# Patient Record
Sex: Female | Born: 1990 | Race: Black or African American | Hispanic: No | Marital: Single | Smoking: Former smoker
Health system: Southern US, Community
[De-identification: ages and names within clinical notes are randomized; demographics above are authoritative.]

## PROBLEM LIST (undated history)

## (undated) ENCOUNTER — Inpatient Hospital Stay (HOSPITAL_COMMUNITY): Payer: Self-pay

## (undated) DIAGNOSIS — IMO0002 Reserved for concepts with insufficient information to code with codable children: Secondary | ICD-10-CM

## (undated) DIAGNOSIS — A599 Trichomoniasis, unspecified: Secondary | ICD-10-CM

## (undated) DIAGNOSIS — B9689 Other specified bacterial agents as the cause of diseases classified elsewhere: Secondary | ICD-10-CM

## (undated) DIAGNOSIS — N760 Acute vaginitis: Secondary | ICD-10-CM

## (undated) DIAGNOSIS — D649 Anemia, unspecified: Secondary | ICD-10-CM

## (undated) HISTORY — DX: Reserved for concepts with insufficient information to code with codable children: IMO0002

## (undated) HISTORY — PX: COLPOSCOPY: SHX161

## (undated) HISTORY — DX: Anemia, unspecified: D64.9

## (undated) HISTORY — PX: TONSILLECTOMY: SUR1361

## (undated) HISTORY — DX: Trichomoniasis, unspecified: A59.9

---
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
## 2008-01-01 ENCOUNTER — Emergency Department (HOSPITAL_COMMUNITY): Admission: EM | Admit: 2008-01-01 | Discharge: 2008-01-01 | Payer: Self-pay | Admitting: Emergency Medicine

## 2008-05-05 ENCOUNTER — Emergency Department (HOSPITAL_COMMUNITY): Admission: EM | Admit: 2008-05-05 | Discharge: 2008-05-05 | Payer: Self-pay | Admitting: Family Medicine

## 2009-02-08 ENCOUNTER — Inpatient Hospital Stay (HOSPITAL_COMMUNITY): Admission: AD | Admit: 2009-02-08 | Discharge: 2009-02-08 | Payer: Self-pay | Admitting: Obstetrics & Gynecology

## 2009-02-10 ENCOUNTER — Inpatient Hospital Stay (HOSPITAL_COMMUNITY): Admission: AD | Admit: 2009-02-10 | Discharge: 2009-02-10 | Payer: Self-pay | Admitting: Obstetrics & Gynecology

## 2010-07-21 ENCOUNTER — Inpatient Hospital Stay (HOSPITAL_COMMUNITY): Admission: AD | Admit: 2010-07-21 | Discharge: 2010-07-21 | Payer: Self-pay | Admitting: Obstetrics and Gynecology

## 2010-07-26 ENCOUNTER — Encounter: Payer: Self-pay | Admitting: Obstetrics & Gynecology

## 2010-07-26 ENCOUNTER — Ambulatory Visit: Payer: Self-pay | Admitting: Obstetrics & Gynecology

## 2010-07-26 LAB — CONVERTED CEMR LAB
Antibody Screen: NEGATIVE
Eosinophils Absolute: 0.3 10*3/uL (ref 0.0–0.7)
Hepatitis B Surface Ag: NEGATIVE
Hgb A2 Quant: 2.6 % (ref 2.2–3.2)
Lymphocytes Relative: 23 % (ref 12–46)
Lymphs Abs: 2.7 10*3/uL (ref 0.7–4.0)
Monocytes Relative: 5 % (ref 3–12)
Neutro Abs: 8.1 10*3/uL — ABNORMAL HIGH (ref 1.7–7.7)
Neutrophils Relative %: 69 % (ref 43–77)
Platelets: 284 10*3/uL (ref 150–400)
RBC: 4.7 M/uL (ref 3.87–5.11)
Rh Type: POSITIVE
Rubella: 21.7 intl units/mL — ABNORMAL HIGH
WBC: 11.8 10*3/uL — ABNORMAL HIGH (ref 4.0–10.5)

## 2010-07-28 ENCOUNTER — Encounter: Payer: Self-pay | Admitting: Physician Assistant

## 2010-07-28 ENCOUNTER — Encounter (INDEPENDENT_AMBULATORY_CARE_PROVIDER_SITE_OTHER): Payer: Self-pay | Admitting: *Deleted

## 2010-07-28 ENCOUNTER — Ambulatory Visit: Payer: Self-pay | Admitting: Obstetrics & Gynecology

## 2010-07-28 LAB — CONVERTED CEMR LAB: Pap Smear: NEGATIVE

## 2010-08-04 ENCOUNTER — Ambulatory Visit: Payer: Self-pay | Admitting: Obstetrics & Gynecology

## 2010-08-10 ENCOUNTER — Ambulatory Visit (HOSPITAL_COMMUNITY): Admission: RE | Admit: 2010-08-10 | Discharge: 2010-08-10 | Payer: Self-pay | Admitting: Family Medicine

## 2010-08-18 ENCOUNTER — Ambulatory Visit: Payer: Self-pay | Admitting: Family Medicine

## 2010-08-20 ENCOUNTER — Inpatient Hospital Stay (HOSPITAL_COMMUNITY)
Admission: AD | Admit: 2010-08-20 | Discharge: 2010-08-21 | Payer: Self-pay | Source: Home / Self Care | Admitting: Obstetrics & Gynecology

## 2010-08-29 ENCOUNTER — Inpatient Hospital Stay (HOSPITAL_COMMUNITY)
Admission: AD | Admit: 2010-08-29 | Discharge: 2010-09-01 | Payer: Self-pay | Source: Home / Self Care | Attending: Obstetrics & Gynecology | Admitting: Obstetrics & Gynecology

## 2010-08-30 ENCOUNTER — Encounter: Payer: Self-pay | Admitting: Obstetrics & Gynecology

## 2010-09-01 ENCOUNTER — Ambulatory Visit: Payer: Self-pay | Admitting: Obstetrics & Gynecology

## 2010-11-29 LAB — GC/CHLAMYDIA PROBE AMP, GENITAL
Chlamydia, DNA Probe: NEGATIVE
Chlamydia, DNA Probe: POSITIVE — AB
GC Probe Amp, Genital: NEGATIVE

## 2010-11-29 LAB — POCT URINALYSIS DIPSTICK
Bilirubin Urine: NEGATIVE
Glucose, UA: NEGATIVE mg/dL
Glucose, UA: NEGATIVE mg/dL
Ketones, ur: NEGATIVE mg/dL
Nitrite: NEGATIVE
Protein, ur: NEGATIVE mg/dL
Protein, ur: NEGATIVE mg/dL
Specific Gravity, Urine: 1.02 (ref 1.005–1.030)
Specific Gravity, Urine: 1.02 (ref 1.005–1.030)
Urobilinogen, UA: 0.2 mg/dL (ref 0.0–1.0)
Urobilinogen, UA: 1 mg/dL (ref 0.0–1.0)
pH: 7.5 (ref 5.0–8.0)

## 2010-11-29 LAB — URINALYSIS, ROUTINE W REFLEX MICROSCOPIC
Bilirubin Urine: NEGATIVE
Glucose, UA: NEGATIVE mg/dL
Glucose, UA: NEGATIVE mg/dL
Ketones, ur: 15 mg/dL — AB
Nitrite: NEGATIVE
Protein, ur: NEGATIVE mg/dL
Specific Gravity, Urine: 1.02 (ref 1.005–1.030)
Specific Gravity, Urine: 1.025 (ref 1.005–1.030)
Urobilinogen, UA: 0.2 mg/dL (ref 0.0–1.0)
Urobilinogen, UA: 1 mg/dL (ref 0.0–1.0)
pH: 6 (ref 5.0–8.0)
pH: 6.5 (ref 5.0–8.0)

## 2010-11-29 LAB — URINE CULTURE
Colony Count: >=100000
Culture  Setup Time: 201112130244

## 2010-11-29 LAB — CBC
Hemoglobin: 11.9 g/dL — ABNORMAL LOW (ref 12.0–15.0)
MCH: 24.3 pg — ABNORMAL LOW (ref 26.0–34.0)
MCV: 76.1 fL — ABNORMAL LOW (ref 78.0–100.0)
RBC: 4.91 MIL/uL (ref 3.87–5.11)

## 2010-11-29 LAB — WET PREP, GENITAL
Clue Cells Wet Prep HPF POC: NONE SEEN
Yeast Wet Prep HPF POC: NONE SEEN

## 2010-11-29 LAB — RAPID URINE DRUG SCREEN, HOSP PERFORMED: Barbiturates: NOT DETECTED

## 2010-11-29 LAB — URINE MICROSCOPIC-ADD ON

## 2010-12-05 ENCOUNTER — Emergency Department (HOSPITAL_COMMUNITY)
Admission: EM | Admit: 2010-12-05 | Discharge: 2010-12-05 | Disposition: A | Discharge status: Home / Self Care | Payer: Medicaid Other | Attending: Emergency Medicine | Admitting: Emergency Medicine

## 2010-12-05 DIAGNOSIS — R0602 Shortness of breath: Secondary | ICD-10-CM | POA: Insufficient documentation

## 2010-12-05 DIAGNOSIS — J45909 Unspecified asthma, uncomplicated: Secondary | ICD-10-CM | POA: Insufficient documentation

## 2010-12-27 LAB — URINALYSIS, ROUTINE W REFLEX MICROSCOPIC
Bilirubin Urine: NEGATIVE
Glucose, UA: NEGATIVE mg/dL
Ketones, ur: NEGATIVE mg/dL
Nitrite: NEGATIVE
Specific Gravity, Urine: 1.02 (ref 1.005–1.030)
pH: 7.5 (ref 5.0–8.0)

## 2010-12-27 LAB — WET PREP, GENITAL

## 2010-12-27 LAB — CBC
Hemoglobin: 11 g/dL — ABNORMAL LOW (ref 12.0–15.0)
MCHC: 32.7 g/dL (ref 30.0–36.0)
MCV: 74.6 fL — ABNORMAL LOW (ref 78.0–100.0)
RBC: 4.51 MIL/uL (ref 3.87–5.11)
RDW: 15 % (ref 11.5–15.5)

## 2010-12-27 LAB — URINE MICROSCOPIC-ADD ON

## 2011-03-09 ENCOUNTER — Inpatient Hospital Stay (HOSPITAL_COMMUNITY)
Admission: AD | Admit: 2011-03-09 | Discharge: 2011-03-09 | Disposition: A | Discharge status: Home / Self Care | Payer: Medicaid Other | Source: Ambulatory Visit | Attending: Obstetrics & Gynecology | Admitting: Obstetrics & Gynecology

## 2011-03-09 DIAGNOSIS — Z3202 Encounter for pregnancy test, result negative: Secondary | ICD-10-CM

## 2011-03-09 DIAGNOSIS — J45909 Unspecified asthma, uncomplicated: Secondary | ICD-10-CM | POA: Insufficient documentation

## 2011-03-09 DIAGNOSIS — R110 Nausea: Secondary | ICD-10-CM

## 2011-03-09 LAB — POCT PREGNANCY, URINE: Preg Test, Ur: NEGATIVE

## 2011-03-09 LAB — URINALYSIS, ROUTINE W REFLEX MICROSCOPIC
Leukocytes, UA: NEGATIVE
Nitrite: NEGATIVE
Protein, ur: NEGATIVE mg/dL
Urobilinogen, UA: 1 mg/dL (ref 0.0–1.0)

## 2011-03-29 ENCOUNTER — Inpatient Hospital Stay (HOSPITAL_COMMUNITY)
Admission: AD | Admit: 2011-03-29 | Discharge: 2011-03-30 | Disposition: A | Discharge status: Home / Self Care | Payer: Medicaid Other | Source: Ambulatory Visit | Attending: Obstetrics & Gynecology | Admitting: Obstetrics & Gynecology

## 2011-03-29 ENCOUNTER — Encounter (HOSPITAL_COMMUNITY): Payer: Self-pay | Admitting: *Deleted

## 2011-03-29 DIAGNOSIS — A499 Bacterial infection, unspecified: Secondary | ICD-10-CM | POA: Insufficient documentation

## 2011-03-29 DIAGNOSIS — B9689 Other specified bacterial agents as the cause of diseases classified elsewhere: Secondary | ICD-10-CM | POA: Insufficient documentation

## 2011-03-29 DIAGNOSIS — R109 Unspecified abdominal pain: Secondary | ICD-10-CM | POA: Insufficient documentation

## 2011-03-29 DIAGNOSIS — N912 Amenorrhea, unspecified: Secondary | ICD-10-CM | POA: Insufficient documentation

## 2011-03-29 DIAGNOSIS — N760 Acute vaginitis: Secondary | ICD-10-CM

## 2011-03-29 LAB — WET PREP, GENITAL: Yeast Wet Prep HPF POC: NONE SEEN

## 2011-03-29 LAB — URINALYSIS, ROUTINE W REFLEX MICROSCOPIC
Bilirubin Urine: NEGATIVE
Glucose, UA: NEGATIVE mg/dL
Nitrite: NEGATIVE

## 2011-03-29 LAB — PREGNANCY, URINE: Preg Test, Ur: NEGATIVE

## 2011-03-29 LAB — POCT PREGNANCY, URINE: Preg Test, Ur: NEGATIVE

## 2011-03-29 MED ORDER — NORGESTIMATE-ETH ESTRADIOL 0.25-35 MG-MCG PO TABS: 1.0000 | ORAL_TABLET | Freq: Every day | ORAL | Status: DC

## 2011-03-29 MED ORDER — METRONIDAZOLE 500 MG PO TABS: 500.0000 mg | ORAL_TABLET | Freq: Two times a day (BID) | ORAL | Status: AC

## 2011-03-29 NOTE — Progress Notes (Signed)
Pt states, " I have missed my period but had a neg HPT. I was cramping in my low abd this am. Brandi Shaw had a vaginal discharge with an odor for three days."

## 2011-03-29 NOTE — Progress Notes (Signed)
S. lineberry at bedside for sse. Cultures and wet prep collected.

## 2011-03-29 NOTE — Progress Notes (Signed)
S. lineberry at bedside.  poc discussed with pt.

## 2011-04-23 NOTE — ED Provider Notes (Signed)
History     Chief Complaint  Patient presents with  . Abdominal Cramping   HPI  Pt is not pregnant and complains of missing a period with negative HPT.  Pt's LMP was 5/20 and she is not using anything for birth control.  She also complains of a vaginal discharge with odor for 3 days.  Pt does admit to gaining weight recently.  She weighs 200 lbs. And is 4'11" tall.  Pt does not want to get pregnant at this time but is concerned about not having a period. Pt denies pain with urination, constipation or diarrhea.  Pt denies dyspareunia.  Past Medical History  Diagnosis Date  . Asthma     Past Surgical History  Procedure Date  . Tonsillectomy   . Colposcopy     Family History  Problem Relation Age of Onset  . Diabetes Mother     History  Substance Use Topics  . Smoking status: Never Smoker   . Smokeless tobacco: Not on file  . Alcohol Use: No    Allergies:  Allergies  Allergen Reactions  . Banana Hives  . Latex Hives  . Penicillins Hives    No prescriptions prior to admission    ROS Physical Exam   Blood pressure 121/61, pulse 70, temperature 98.6 F (37 C), temperature source Oral, resp. rate 18, height 4\' 11"  (1.499 m), weight 200 lb (90.719 kg), last menstrual period 02/05/2011.    Physical Exam Generally well developed WN pleasant obese female in no acute distress.VSS  Nl ROM; neck supple; Abdomen soft nontender; mod amount of frothy white discharge in vault; cervix clean, NT, uterus NSSC nontender; adnexal without palpable enlargement or tenderness.  Exam complicated by obesity.   MAU Course  Procedures pevic exam with wet prep and GC/chlamdyia cultures taken Wet prep showed few clues and few WBCs. MDM   Assessment and Plan Bacterial vaginosis- will treat with Flagyl 500 mg BID for 7 days Amenorrhea with negative pregnancy test- will start pt on BCP and have pt f/u in GYN clinic    [facility] 04/23/2011, 5:11 PM

## 2011-05-12 ENCOUNTER — Encounter: Payer: Medicaid Other | Admitting: Obstetrics and Gynecology

## 2011-06-07 DIAGNOSIS — J45909 Unspecified asthma, uncomplicated: Secondary | ICD-10-CM | POA: Insufficient documentation

## 2011-06-07 DIAGNOSIS — IMO0002 Reserved for concepts with insufficient information to code with codable children: Secondary | ICD-10-CM | POA: Insufficient documentation

## 2011-06-08 ENCOUNTER — Encounter: Payer: Medicaid Other | Admitting: Obstetrics and Gynecology

## 2011-06-13 LAB — POCT URINALYSIS DIP (DEVICE)
Glucose, UA: NEGATIVE
Ketones, ur: NEGATIVE
Nitrite: NEGATIVE
pH: 5.5

## 2011-06-13 LAB — POCT PREGNANCY, URINE
Operator id: 239701
Preg Test, Ur: NEGATIVE

## 2011-06-19 ENCOUNTER — Encounter (HOSPITAL_COMMUNITY): Payer: Self-pay | Admitting: *Deleted

## 2011-06-19 ENCOUNTER — Inpatient Hospital Stay (HOSPITAL_COMMUNITY)
Admission: AD | Admit: 2011-06-19 | Discharge: 2011-06-19 | Disposition: A | Discharge status: Home / Self Care | Payer: Medicaid Other | Source: Ambulatory Visit | Attending: Obstetrics and Gynecology | Admitting: Obstetrics and Gynecology

## 2011-06-19 DIAGNOSIS — O99891 Other specified diseases and conditions complicating pregnancy: Secondary | ICD-10-CM | POA: Insufficient documentation

## 2011-06-19 DIAGNOSIS — Z3200 Encounter for pregnancy test, result unknown: Secondary | ICD-10-CM

## 2011-06-19 DIAGNOSIS — R109 Unspecified abdominal pain: Secondary | ICD-10-CM | POA: Insufficient documentation

## 2011-06-19 LAB — POCT PREGNANCY, URINE: Preg Test, Ur: POSITIVE

## 2011-06-19 NOTE — Progress Notes (Signed)
H. Neese, NP at bedside.  Assessment done and poc discussed with pt.  

## 2011-06-19 NOTE — ED Provider Notes (Signed)
History     CSN: [id number] Arrival date & time: 06/19/2011  8:19 PM  Chief Complaint  Patient presents with  . Abdominal Cramping  . Amenorrhea   HPI Brandi Shaw is a 20 y.o. female who presents to MAU for a pregnancy test. She has had nausea, breast tenderness and frequent urination. She denies abdominal pain. The history was provided by the patient.  Past Medical History  Diagnosis Date  . Asthma   . Abnormal Pap smear 2006    had colposcopy  . Trichimoniasis     Past Surgical History  Procedure Date  . Tonsillectomy   . Colposcopy     Family History  Problem Relation Age of Onset  . Diabetes Mother   . Drug abuse Mother     crack    History  Substance Use Topics  . Smoking status: Never Smoker   . Smokeless tobacco: Not on file  . Alcohol Use: No    OB History    Grav Para Term Preterm Abortions TAB SAB Ect Mult Living   3 1 0 1 1  1  0 0 1      Review of Systems  Allergies  Banana; Latex; and Penicillins  Home Medications  No current outpatient prescriptions on file.  BP 125/64  Pulse 76  Temp(Src) 99 F (37.2 C) (Oral)  Resp 16  Ht 5' 0.5" (1.537 m)  Wt 202 lb 3.2 oz (91.717 kg)  BMI 38.84 kg/m2  LMP 05/07/2011  Breastfeeding? No  Physical Exam  Nursing note and vitals reviewed. Constitutional: She is oriented to person, place, and time. No distress.       obese  HENT:  Head: Normocephalic.  Eyes: EOM are normal.  Neck: Neck supple.  Cardiovascular: Normal rate.   Pulmonary/Chest: Effort normal.  Musculoskeletal: Normal range of motion.  Neurological: She is alert and oriented to person, place, and time. No cranial nerve deficit.    ED Course  Procedures  Assessment: Pregnant  Plan: Patient states she is planning to have abortion  [facility] will [facility].  She will return here for any problems. MDM          Kerrie Buffalo, NP 06/19/11 2256

## 2011-06-19 NOTE — Progress Notes (Signed)
Pt c/o sore nipples and some cramping like a period is about to come on.  Not sure if she is about to start her period or not.

## 2011-06-19 NOTE — Progress Notes (Signed)
Pt G2 P1, LMP 05/07/2011, having cramping, sore breast.

## 2011-06-20 NOTE — ED Provider Notes (Signed)
Agree with above note.  Brandi Shaw 06/20/2011 7:47 AM   

## 2012-03-06 ENCOUNTER — Inpatient Hospital Stay (HOSPITAL_COMMUNITY)
Admission: AD | Admit: 2012-03-06 | Discharge: 2012-03-06 | Disposition: A | Discharge status: Home / Self Care | Payer: Medicaid Other | Source: Ambulatory Visit | Attending: Obstetrics & Gynecology | Admitting: Obstetrics & Gynecology

## 2012-03-06 ENCOUNTER — Encounter (HOSPITAL_COMMUNITY): Payer: Self-pay

## 2012-03-06 DIAGNOSIS — N390 Urinary tract infection, site not specified: Secondary | ICD-10-CM

## 2012-03-06 DIAGNOSIS — R350 Frequency of micturition: Secondary | ICD-10-CM | POA: Insufficient documentation

## 2012-03-06 DIAGNOSIS — O2390 Unspecified genitourinary tract infection in pregnancy, unspecified trimester: Secondary | ICD-10-CM | POA: Insufficient documentation

## 2012-03-06 LAB — URINALYSIS, ROUTINE W REFLEX MICROSCOPIC
Glucose, UA: NEGATIVE mg/dL
Protein, ur: 30 mg/dL — AB
Specific Gravity, Urine: 1.02 (ref 1.005–1.030)
pH: 6.5 (ref 5.0–8.0)

## 2012-03-06 LAB — POCT PREGNANCY, URINE: Preg Test, Ur: POSITIVE — AB

## 2012-03-06 LAB — URINE MICROSCOPIC-ADD ON

## 2012-03-06 MED ORDER — PHENAZOPYRIDINE HCL 200 MG PO TABS: 200.0000 mg | ORAL_TABLET | Freq: Three times a day (TID) | ORAL | Status: AC | PRN

## 2012-03-06 MED ORDER — SULFAMETHOXAZOLE-TMP DS 800-160 MG PO TABS
1.0000 | ORAL_TABLET | Freq: Once | ORAL | Status: AC
  Administered 2012-03-06: 1 via ORAL
  Filled 2012-03-06: qty 1

## 2012-03-06 MED ORDER — PHENAZOPYRIDINE HCL 100 MG PO TABS
200.0000 mg | ORAL_TABLET | Freq: Once | ORAL | Status: AC
  Administered 2012-03-06: 200 mg via ORAL
  Filled 2012-03-06: qty 2

## 2012-03-06 MED ORDER — SULFAMETHOXAZOLE-TRIMETHOPRIM 400-80 MG PO TABS: 1.0000 | ORAL_TABLET | Freq: Two times a day (BID) | ORAL | Status: DC

## 2012-03-06 MED ORDER — SULFAMETHOXAZOLE-TRIMETHOPRIM 400-80 MG PO TABS: 1.0000 | ORAL_TABLET | Freq: Once | ORAL | Status: DC

## 2012-03-06 MED ORDER — SULFAMETHOXAZOLE-TMP DS 800-160 MG PO TABS: 1.0000 | ORAL_TABLET | Freq: Two times a day (BID) | ORAL | Status: AC

## 2012-03-06 NOTE — Discharge Instructions (Signed)
Urinary Tract Infection in Pregnancy A urinary tract infection (UTI) is a bacterial infection of the urinary tract. Infection of the urinary tract can include the ureters, kidneys (pyelonephritis), bladder (cystitis), and urethra (urethritis). All pregnant women should be screened for bacteria in the urinary tract. Identifying and treating a UTI will decrease the risk of preterm labor and developing more serious infections in both the mother and baby. CAUSES Bacteria germs cause almost all UTIs. There are many factors that can increase your chances of getting a UTI during pregnancy. These include:  Having a short urethra.   Poor toilet and hygiene habits.   Sexual intercourse.   Blockage of urine along the urinary tract.   Problems with the pelvic muscles or nerves.   Diabetes.   Obesity.   Bladder problems after having several children.   Previous history of UTI.  SYMPTOMS   Pain, burning, or a stinging feeling when urinating.   Suddenly feeling the need to urinate right away (urgency).   Loss of bladder control (urinary incontinence).   Frequent urination, more than is common with pregnancy.   Lower abdominal or back discomfort.   Bad smelling urine.   Cloudy urine.   Blood in the urine (hematuria).   Fever.  When the kidneys are infected, the symptoms may be:  Back pain.   Flank pain on the right side more so than the left.   Fever.   Chills.   Nausea.   Vomiting.  DIAGNOSIS   Urine tests.   Additional tests and procedures may include:   Ultrasound of the kidneys, ureters, bladder, and urethra.   Looking in the bladder with a lighted tube (cystoscopy).   Certain X-ray studies only when absolutely necessary.  Finding out the results of your test Ask when your test results will be ready. Make sure you get your test results. TREATMENT  Antibiotic medicine by mouth.   Antibiotics given through the vein (intravenously), if needed.  HOME CARE  INSTRUCTIONS   Take your antibiotics as directed. Finish them even if you start to feel better. Only take medicine as directed by your caregiver.   Drink enough fluids to keep your urine clear or pale yellow.   Do not have sexual intercourse until the infection is gone and your caregiver says it is okay.   Make sure you are tested for UTIs throughout your pregnancy if you get one. These infections often come back.  Preventing a UTI in the future:  Practice good toilet habits. Always wipe from front to back. Use the tissue only once.   Do not hold your urine. Empty your bladder as soon as possible when the urge comes.   Do not douche or use deodorant sprays.   Wash with soap and warm water around the genital area and the anus.   Empty your bladder before and after sexual intercourse.   Wear underwear with a cotton crotch.   Avoid caffeine and carbonated drinks. They can irritate the bladder.   Drink cranberry juice or take cranberry pills. This may decrease the risk of getting a UTI.   Do not drink alcohol.   Keep all your appointments and tests as scheduled.  SEEK MEDICAL CARE IF:   Your symptoms get worse.   You are still having fevers 2 or more days after treatment begins.   You develop a rash.   You feel that you are having problems with medicines prescribed.   You develop abnormal vaginal discharge.  SEEK IMMEDIATE MEDICAL   CARE IF:   You develop back or flank pain.   You develop chills.   You have blood in your urine.   You develop nausea and vomiting.   You develop contractions of your uterus.   You have a gush of fluid from the vagina.  MAKE SURE YOU:   Understand these instructions.   Will watch your condition.   Will get help right away if you are not doing well or get worse.  Document Released: 12/30/2010 Document Revised: 08/24/2011 Document Reviewed: 12/30/2010 [facility] Patient Information 2012 [place], [place].Prenatal Care [facility]    [facility]  [phone]     Phone: [phone]          [facility]                      [facility]  @[place]     Phone: [phone]  Phone: [phone]         [facility]     Phone: [phone]  Phone: [phone]           [facility] @ [place] [address] [place]                hone: [phone]  Phone: [phone]         [facility] Dr. Francoise Ceo      Phone: [phone]  Phone: [phone]         [facility] Dept.                Phone: [phone]  [facility]   [phone]    Family [address] [place])          Phone: [phone] [facility]   Phone: [phone]    ________________________________________     To schedule your Maternity Eligibility Appointment, please call [phone].  When you arrive for your appointment you must bring the following items or information listed below.  Your appointment will be rescheduled if you do not have these items or are 15 minutes late. If currently receiving Medicaid, you MUST bring: 1. Medicaid Card 2. Social Security Card 3. Picture ID 4. Proof of Pregnancy 5. Verification of current address if the address on Medicaid card is incorrect "postmarked mail" If not receiving Medicaid, you MUST bring: 1. Social Security Card 2. Picture ID 3. Birth Certificate (if available) Passport or *Green Card 4. Proof of Pregnancy 5. Verification of current address "postmarked mail" for each income presented. 6. Verification of insurance coverage, if any 7. Check stubs from each employer for the previous month (if unable to present check stub  for each week, we will accept check stub for the first and last week ill the same month.) If you can't locate check stubs, you must bring a letter from the employer(s) and it must have the following  information on letterhead, typed, in English: o name of company o company telephone number o how long been with the company, if less than one month o how much person earns per hour o how many hours per week work o the gross pay the person earned for the previous month If you are 23 years old or less, you do not have to bring proof of income unless you work or live with the father of the baby and at that time we will need proof of income  from you and/or the father of the baby. Green Card recipients are eligible for Medicaid for Pregnant Women (MPW)

## 2012-03-06 NOTE — MAU Note (Signed)
Patient is in with c/o dysuria, burning with urination and to verify if she is pregnant. She denies any vaginal bleeding. She also c/o tender breast, white thick odor vaginal discharge.

## 2012-03-06 NOTE — MAU Note (Signed)
Onset of dysuria x 2 to 3 days with urinary frequency LMP 01/21/12

## 2012-03-06 NOTE — MAU Provider Note (Signed)
Chief Complaint:  Urinary Frequency and Dysuria    First Provider Initiated Contact with Patient 03/06/12 2029      Brandi Shaw is  21 y.o. [id number].  Patient's last menstrual period was 01/21/2012.Marland Kitchen  Her pregnancy status is positive.  She presents complaining of Urinary Frequency and Dysuria . Onset is described as intermittent and has been present for  2-3 days. Denies abd pain, vaginal bleeding, vag d/c, fever, chills, back pain, nausea, vomiting or diarrhea.  Obstetrical/Gynecological History: OB History    Grav Para Term Preterm Abortions TAB SAB Ect Mult Living   5 1 0 1 2 1 1 0 0 1       Past Medical History: Past Medical History  Diagnosis Date  . Asthma   . Abnormal Pap smear 2006    had colposcopy  . Trichimoniasis     Past Surgical History: Past Surgical History  Procedure Date  . Tonsillectomy   . Colposcopy     Family History: Family History  Problem Relation Age of Onset  . Diabetes Mother   . Drug abuse Mother     crack    Social History: History  Substance Use Topics  . Smoking status: Never Smoker   . Smokeless tobacco: Not on file  . Alcohol Use: No    Allergies:  Allergies  Allergen Reactions  . Banana Hives  . Latex Hives  . Penicillins Hives    Prescriptions prior to admission  Medication Sig Dispense Refill  . Acetaminophen (TYLENOL 8 HOUR PO) Take 200 mg by mouth daily as needed.        Marland Kitchen albuterol (PROVENTIL,VENTOLIN) 90 MCG/ACT inhaler Inhale 1 puff into the lungs every 6 (six) hours as needed. Shortness of breathe       . norgestimate-ethinyl estradiol (ORTHO-CYCLEN) 0.25-35 MG-MCG per tablet Take 1 tablet by mouth daily.  28 tablet  2    Review of Systems - Negative except what has been reviewed in HPI  Physical Exam   Blood pressure 130/82, pulse 93, temperature 98.6 F (37 C), temperature source Oral, resp. rate 20, height 5' (1.524 m), weight 184 lb 6.4 oz (83.643 kg), last menstrual period 01/21/2012, unknown if  currently breastfeeding.  General: General appearance - alert, well appearing, and in no distress, oriented to person, place, and time and overweight Mental status - alert, oriented to person, place, and time, normal mood, behavior, speech, dress, motor activity, and thought processes, affect appropriate to mood Abdomen - soft, nontender, nondistended, no masses or organomegaly no CVA tenderness Focused Gynecological Exam: exam declined by the patient  Labs: Recent Results (from the past 24 hour(s))  URINALYSIS, ROUTINE W REFLEX MICROSCOPIC   Collection Time   03/06/12  6:40 PM      Component Value Range   Color, Urine YELLOW  YELLOW   APPearance CLEAR  CLEAR   Specific Gravity, Urine 1.020  1.005 - 1.030   pH 6.5  5.0 - 8.0   Glucose, UA NEGATIVE  NEGATIVE mg/dL   Hgb urine dipstick SMALL (*) NEGATIVE   Bilirubin Urine NEGATIVE  NEGATIVE   Ketones, ur 15 (*) NEGATIVE mg/dL   Protein, ur 30 (*) NEGATIVE mg/dL   Urobilinogen, UA 1.0  0.0 - 1.0 mg/dL   Nitrite NEGATIVE  NEGATIVE   Leukocytes, UA TRACE (*) NEGATIVE  URINE MICROSCOPIC-ADD ON   Collection Time   03/06/12  6:40 PM      Component Value Range   Squamous Epithelial / LPF MANY (*) RARE  WBC, UA 7-10  <3 WBC/hpf   RBC / HPF 3-6  <3 RBC/hpf   Urine-Other MUCOUS PRESENT    POCT PREGNANCY, URINE   Collection Time   03/06/12  7:01 PM      Component Value Range   Preg Test, Ur POSITIVE (*) NEGATIVE   Imaging Studies:  Bedside US shows [redacted]w[redacted]d IUP with +Fetal pole, + yolk sac + cardiac activity   Assessment: 1. UTI (lower urinary tract infection)     Plan: Discharge home Rx Septra DS, Pyridium Urine culture pending Referral to health dept for MCD and initiate care, pt will be transferred to [facility] due to hx PTD   Tequia Wolman E. 03/06/2012,8:41 PM

## 2012-03-15 ENCOUNTER — Encounter (HOSPITAL_COMMUNITY): Payer: Self-pay | Admitting: *Deleted

## 2012-03-15 ENCOUNTER — Inpatient Hospital Stay (HOSPITAL_COMMUNITY)
Admission: AD | Admit: 2012-03-15 | Discharge: 2012-03-15 | Disposition: A | Discharge status: Home / Self Care | Payer: Medicaid Other | Source: Ambulatory Visit | Attending: Obstetrics & Gynecology | Admitting: Obstetrics & Gynecology

## 2012-03-15 DIAGNOSIS — N390 Urinary tract infection, site not specified: Secondary | ICD-10-CM

## 2012-03-15 DIAGNOSIS — O210 Mild hyperemesis gravidarum: Secondary | ICD-10-CM | POA: Insufficient documentation

## 2012-03-15 DIAGNOSIS — O219 Vomiting of pregnancy, unspecified: Secondary | ICD-10-CM

## 2012-03-15 DIAGNOSIS — O2390 Unspecified genitourinary tract infection in pregnancy, unspecified trimester: Secondary | ICD-10-CM | POA: Insufficient documentation

## 2012-03-15 LAB — WET PREP, GENITAL: Clue Cells Wet Prep HPF POC: NONE SEEN

## 2012-03-15 LAB — URINALYSIS, ROUTINE W REFLEX MICROSCOPIC
Hgb urine dipstick: NEGATIVE
Specific Gravity, Urine: 1.025 (ref 1.005–1.030)
Urobilinogen, UA: 1 mg/dL (ref 0.0–1.0)

## 2012-03-15 MED ORDER — PROMETHAZINE HCL 12.5 MG PO TABS: 12.5000 mg | ORAL_TABLET | Freq: Four times a day (QID) | ORAL | Status: DC | PRN

## 2012-03-15 NOTE — MAU Note (Signed)
Patient states she is taking medication for a UTI that she thinks might be making her having nausea and vomiting. Having a vaginal discharge with odor and vaginal stinging. States she is not getting better.

## 2012-03-15 NOTE — Discharge Instructions (Signed)
Urinary Tract Infection in Pregnancy A urinary tract infection (UTI) is a bacterial infection of the urinary tract. Infection of the urinary tract can include the ureters, kidneys (pyelonephritis), bladder (cystitis), and urethra (urethritis). All pregnant women should be screened for bacteria in the urinary tract. Identifying and treating a UTI will decrease the risk of preterm labor and developing more serious infections in both the mother and baby. CAUSES Bacteria germs cause almost all UTIs. There are many factors that can increase your chances of getting a UTI during pregnancy. These include:  Having a short urethra.   Poor toilet and hygiene habits.   Sexual intercourse.   Blockage of urine along the urinary tract.   Problems with the pelvic muscles or nerves.   Diabetes.   Obesity.   Bladder problems after having several children.   Previous history of UTI.  SYMPTOMS   Pain, burning, or a stinging feeling when urinating.   Suddenly feeling the need to urinate right away (urgency).   Loss of bladder control (urinary incontinence).   Frequent urination, more than is common with pregnancy.   Lower abdominal or back discomfort.   Bad smelling urine.   Cloudy urine.   Blood in the urine (hematuria).   Fever.  When the kidneys are infected, the symptoms may be:  Back pain.   Flank pain on the right side more so than the left.   Fever.   Chills.   Nausea.   Vomiting.  DIAGNOSIS   Urine tests.   Additional tests and procedures may include:   Ultrasound of the kidneys, ureters, bladder, and urethra.   Looking in the bladder with a lighted tube (cystoscopy).   Certain X-ray studies only when absolutely necessary.  Finding out the results of your test Ask when your test results will be ready. Make sure you get your test results. TREATMENT  Antibiotic medicine by mouth.   Antibiotics given through the vein (intravenously), if needed.  HOME CARE  INSTRUCTIONS   Take your antibiotics as directed. Finish them even if you start to feel better. Only take medicine as directed by your caregiver.   Drink enough fluids to keep your urine clear or pale yellow.   Do not have sexual intercourse until the infection is gone and your caregiver says it is okay.   Make sure you are tested for UTIs throughout your pregnancy if you get one. These infections often come back.  Preventing a UTI in the future:  Practice good toilet habits. Always wipe from front to back. Use the tissue only once.   Do not hold your urine. Empty your bladder as soon as possible when the urge comes.   Do not douche or use deodorant sprays.   Wash with soap and warm water around the genital area and the anus.   Empty your bladder before and after sexual intercourse.   Wear underwear with a cotton crotch.   Avoid caffeine and carbonated drinks. They can irritate the bladder.   Drink cranberry juice or take cranberry pills. This may decrease the risk of getting a UTI.   Do not drink alcohol.   Keep all your appointments and tests as scheduled.  SEEK MEDICAL CARE IF:   Your symptoms get worse.   You are still having fevers 2 or more days after treatment begins.   You develop a rash.   You feel that you are having problems with medicines prescribed.   You develop abnormal vaginal discharge.  SEEK IMMEDIATE MEDICAL   CARE IF:   You develop back or flank pain.   You develop chills.   You have blood in your urine.   You develop nausea and vomiting.   You develop contractions of your uterus.   You have a gush of fluid from the vagina.  MAKE SURE YOU:   Understand these instructions.   Will watch your condition.   Will get help right away if you are not doing well or get worse.  Document Released: 12/30/2010 Document Revised: 08/24/2011 Document Reviewed: 12/30/2010 [employer] Patient Information 2012 [employer]. 

## 2012-03-15 NOTE — MAU Provider Note (Signed)
Brandi Shaw [id number] @[redacted]w[redacted]d  by LMP Chief Complaint  Patient presents with  . Emesis During Pregnancy     First Provider Initiated Contact with Patient 03/15/12 1702      SUBJECTIVE  HPI: She presented here 03/06/2012 symptomatic for UTI and was treated with Bactrim. She's missed some doses due to nausea and vomiting of pregnancy. She is concerned that she still has end stream burning sensation. (No urine culture done.)  Did not have pelvic at last visit. Denies abnormal vaginal discharge. No systemic UTI symptoms, i.e. no fever, chills, back pain. Does not have meds for N/V.  OB HX: SVD at 33 wks  Past Medical History  Diagnosis Date  . Asthma   . Abnormal Pap smear 2006    had colposcopy  . Trichimoniasis    Past Surgical History  Procedure Date  . Tonsillectomy   . Colposcopy    History   Social History  . Marital Status: Single    Spouse Name: N/A    Number of Children: N/A  . Years of Education: N/A   Occupational History  . Not on file.   Social History Main Topics  . Smoking status: Former Smoker    Quit date: 03/15/2008  . Smokeless tobacco: Not on file  . Alcohol Use: No  . Drug Use: No  . Sexually Active: Yes   Other Topics Concern  . Not on file   Social History Narrative  . No narrative on file   No current facility-administered medications on file prior to encounter.   Current Outpatient Prescriptions on File Prior to Encounter  Medication Sig Dispense Refill  . sulfamethoxazole-trimethoprim (BACTRIM DS) 800-160 MG per tablet Take 1 tablet by mouth 2 (two) times daily.  14 tablet  0  . albuterol (PROVENTIL,VENTOLIN) 90 MCG/ACT inhaler Inhale 1 puff into the lungs every 6 (six) hours as needed. Shortness of breathe        Allergies  Allergen Reactions  . Banana Hives  . Latex Hives  . Penicillins Hives    ROS: Pertinent items in HPI  OBJECTIVE Blood pressure 122/53, pulse 95, temperature 98.8 F (37.1 C), temperature source  Oral, resp. rate 16, weight 81.103 kg (178 lb 12.8 oz), last menstrual period 01/21/2012, SpO2 100.00%, unknown if currently breastfeeding.  GENERAL: Obese female in no acute distress.  HEENT: Normocephalic, good dentition HEART: normal rate RESP: normal effort ABDOMEN: Soft, nontender EXTREMITIES: Nontender, no edema NEURO: Alert and oriented SPECULUM EXAM: NEFG, physiologic discharge, no blood noted, cervix clean BIMANUAL: cervix L/C/H; uterus NSSP; no adnexal tenderness or masses   LAB RESULTS  Results for orders placed during the hospital encounter of 03/15/12 (from the past 24 hour(s))  URINALYSIS, ROUTINE W REFLEX MICROSCOPIC     Status: Abnormal   Collection Time   03/15/12  3:10 PM      Component Value Range   Color, Urine YELLOW  YELLOW   APPearance CLOUDY (*) CLEAR   Specific Gravity, Urine 1.025  1.005 - 1.030   pH 6.5  5.0 - 8.0   Glucose, UA NEGATIVE  NEGATIVE mg/dL   Hgb urine dipstick NEGATIVE  NEGATIVE   Bilirubin Urine SMALL (*) NEGATIVE   Ketones, ur NEGATIVE  NEGATIVE mg/dL   Protein, ur NEGATIVE  NEGATIVE mg/dL   Urobilinogen, UA 1.0  0.0 - 1.0 mg/dL   Nitrite NEGATIVE  NEGATIVE   Leukocytes, UA NEGATIVE  NEGATIVE  WET PREP, GENITAL     Status: Abnormal   Collection Time  03/15/12  5:25 PM      Component Value Range   Yeast Wet Prep HPF POC NONE SEEN  NONE SEEN   Trich, Wet Prep NONE SEEN  NONE SEEN   Clue Cells Wet Prep HPF POC NONE SEEN  NONE SEEN   WBC, Wet Prep HPF POC FEW (*) NONE SEEN    IMAGING (Viable IUP at prior visit)  ASSESSMENT [id number] at [redacted]w[redacted]d UTI partially treated-> urine culture sent. Continue Pyridium 1. UTI (urinary tract infection)   2. Nausea and vomiting of pregnancy, antepartum   3. Hx PTB  PLAN  Urine C&S sent  Medication List  As of 03/15/2012  5:12 PM   ASK your doctor about these medications         albuterol 90 MCG/ACT inhaler   Commonly known as: PROVENTIL,VENTOLIN   Inhale 1 puff into the lungs every 6 (six)  hours as needed. Shortness of breathe      PYRIDIUM 100 MG tablet   Generic drug: phenazopyridine   Take 100 mg by mouth 3 (three) times daily as needed. For pain.      sulfamethoxazole-trimethoprim 800-160 MG per tablet   Commonly known as: BACTRIM DS   Take 1 tablet by mouth 2 (two) times daily.           Rx: Phenergan 12.5mg  q 4-6 h prn N/V F/U: Someone from high risk clinic will call you for appointment. Explained need for early [facility] and 17-P       Mishawn Didion 03/15/2012 5:12 PM

## 2012-03-17 LAB — URINE CULTURE: Special Requests: NORMAL

## 2012-03-19 NOTE — MAU Provider Note (Signed)
Medical Screening exam and patient care preformed by advanced practice provider.  Agree with the above management.  

## 2012-04-17 ENCOUNTER — Inpatient Hospital Stay (HOSPITAL_COMMUNITY)
Admission: AD | Admit: 2012-04-17 | Discharge: 2012-04-18 | Disposition: A | Discharge status: Home / Self Care | Payer: Medicaid Other | Source: Ambulatory Visit | Attending: Obstetrics and Gynecology | Admitting: Obstetrics and Gynecology

## 2012-04-17 ENCOUNTER — Encounter (HOSPITAL_COMMUNITY): Payer: Self-pay | Admitting: *Deleted

## 2012-04-17 DIAGNOSIS — O0930 Supervision of pregnancy with insufficient antenatal care, unspecified trimester: Secondary | ICD-10-CM | POA: Insufficient documentation

## 2012-04-17 DIAGNOSIS — O2390 Unspecified genitourinary tract infection in pregnancy, unspecified trimester: Secondary | ICD-10-CM | POA: Insufficient documentation

## 2012-04-17 DIAGNOSIS — R109 Unspecified abdominal pain: Secondary | ICD-10-CM | POA: Insufficient documentation

## 2012-04-17 DIAGNOSIS — B3731 Acute candidiasis of vulva and vagina: Secondary | ICD-10-CM | POA: Diagnosis present

## 2012-04-17 DIAGNOSIS — B373 Candidiasis of vulva and vagina: Secondary | ICD-10-CM | POA: Insufficient documentation

## 2012-04-17 LAB — URINALYSIS, ROUTINE W REFLEX MICROSCOPIC
Glucose, UA: NEGATIVE mg/dL
Leukocytes, UA: NEGATIVE
Specific Gravity, Urine: 1.025 (ref 1.005–1.030)
pH: 6 (ref 5.0–8.0)

## 2012-04-17 LAB — CBC
MCH: 22.5 pg — ABNORMAL LOW (ref 26.0–34.0)
MCV: 70.2 fL — ABNORMAL LOW (ref 78.0–100.0)
Platelets: 279 10*3/uL (ref 150–400)
RDW: 14.2 % (ref 11.5–15.5)

## 2012-04-17 LAB — WET PREP, GENITAL
Trich, Wet Prep: NONE SEEN
Yeast Wet Prep HPF POC: NONE SEEN

## 2012-04-17 NOTE — MAU Provider Note (Signed)
Brandi Shaw [id number] @[redacted]w[redacted]d  by LMP Chief Complaint  Patient presents with  . Abdominal Cramping     First Provider Initiated Contact with Patient 04/17/12 2236      SUBJECTIVE  HPI: Pt presents to MAU with abdominal cramping and vaginal discharge and pink spotting x2 days.  She also has vaginal itching today.  She has not had prenatal care in this pregnancy and has not yet applied for pregnancy Medicaid but has had visits in the MAU. She denies LOF, bright red vaginal bleeding, urinary symptoms, h/a, dizziness, n/v, or fever/chills.    Past Medical History  Diagnosis Date  . Asthma   . Abnormal Pap smear 2006    had colposcopy  . Trichimoniasis    Past Surgical History  Procedure Date  . Tonsillectomy   . Colposcopy    History   Social History  . Marital Status: Single    Spouse Name: N/A    Number of Children: N/A  . Years of Education: N/A   Occupational History  . Not on file.   Social History Main Topics  . Smoking status: Former Smoker    Quit date: 03/15/2008  . Smokeless tobacco: Not on file  . Alcohol Use: No  . Drug Use: No  . Sexually Active: Yes   Other Topics Concern  . Not on file   Social History Narrative  . No narrative on file   No current facility-administered medications on file prior to encounter.   Current Outpatient Prescriptions on File Prior to Encounter  Medication Sig Dispense Refill  . albuterol (PROVENTIL,VENTOLIN) 90 MCG/ACT inhaler Inhale 1 puff into the lungs every 6 (six) hours as needed. Shortness of breathe       . phenazopyridine (PYRIDIUM) 100 MG tablet Take 100 mg by mouth 3 (three) times daily as needed. For pain.      . promethazine (PHENERGAN) 12.5 MG tablet Take 1 tablet (12.5 mg total) by mouth every 6 (six) hours as needed for nausea.  30 tablet  0   Allergies  Allergen Reactions  . Banana Hives  . Latex Hives  . Penicillins Hives    ROS: Pertinent items in HPI  OBJECTIVE Blood pressure 120/74,  pulse 93, temperature 99.3 F (37.4 C), temperature source Oral, resp. rate 20, height 5' (1.524 m), weight 78.019 kg (172 lb), last menstrual period 01/21/2012, SpO2 100.00%.  GENERAL: Well-developed, well-nourished female in no acute distress.  HEENT: Normocephalic, good dentition HEART: normal rate RESP: normal effort ABDOMEN: Soft, nontender EXTREMITIES: Nontender, no edema NEURO: Alert and oriented Pelvic exam: Cervix pink, some patches of erythema, visually closed,  scant white discharge with clumps, vaginal walls with mild erythema, and external genitalia normal Bimanual exam: Cervix 0/long/high, firm, posterior  LAB RESULTS  Results for orders placed during the hospital encounter of 04/17/12 (from the past 24 hour(s))  URINALYSIS, ROUTINE W REFLEX MICROSCOPIC     Status: Abnormal   Collection Time   04/17/12  9:30 PM      Component Value Range   Color, Urine YELLOW  YELLOW   APPearance CLEAR  CLEAR   Specific Gravity, Urine 1.025  1.005 - 1.030   pH 6.0  5.0 - 8.0   Glucose, UA NEGATIVE  NEGATIVE mg/dL   Hgb urine dipstick NEGATIVE  NEGATIVE   Bilirubin Urine NEGATIVE  NEGATIVE   Ketones, ur 15 (*) NEGATIVE mg/dL   Protein, ur NEGATIVE  NEGATIVE mg/dL   Urobilinogen, UA 1.0  0.0 - 1.0 mg/dL  Nitrite NEGATIVE  NEGATIVE   Leukocytes, UA NEGATIVE  NEGATIVE  WET PREP, GENITAL     Status: Abnormal   Collection Time   04/17/12 11:15 PM      Component Value Range   Yeast Wet Prep HPF POC NONE SEEN  NONE SEEN   Trich, Wet Prep NONE SEEN  NONE SEEN   Clue Cells Wet Prep HPF POC NONE SEEN  NONE SEEN   WBC, Wet Prep HPF POC FEW (*) NONE SEEN  CBC     Status: Abnormal   Collection Time   04/17/12 11:27 PM      Component Value Range   WBC 11.7 (*) 4.0 - 10.5 K/uL   RBC 4.40  3.87 - 5.11 MIL/uL   Hemoglobin 9.9 (*) 12.0 - 15.0 g/dL   HCT 16.1 (*) 09.6 - 04.5 %   MCV 70.2 (*) 78.0 - 100.0 fL   MCH 22.5 (*) 26.0 - 34.0 pg   MCHC 32.0  30.0 - 36.0 g/dL   RDW 40.9  81.1 - 91.4  %   Platelets 279  150 - 400 K/uL    IMAGING Bedside sono indicates FHR, fetal movement, and adequate fluid  ASSESSMENT Candidal vaginitis No prenatal care  PLAN Diflucan 150 mg PO x1 dose in MAU D/C home with bleeding precautions Begin prenatal care as soon as possible Message sent to [facility] for appointment Return to MAU as needed  Medication List  As of 04/17/2012 11:58 PM   ASK your doctor about these medications         albuterol 90 MCG/ACT inhaler   Commonly known as: PROVENTIL,VENTOLIN   Inhale 1 puff into the lungs every 6 (six) hours as needed. Shortness of breathe      ibuprofen 200 MG tablet   Commonly known as: ADVIL,MOTRIN   Take 200 mg by mouth every 6 (six) hours as needed. headache      promethazine 12.5 MG tablet   Commonly known as: PHENERGAN   Take 1 tablet (12.5 mg total) by mouth every 6 (six) hours as needed for nausea.      PYRIDIUM 100 MG tablet   Generic drug: phenazopyridine   Take 100 mg by mouth 3 (three) times daily as needed. For pain.           No Follow-up on file.     Brandi Shaw 04/17/2012 11:58 PM

## 2012-04-17 NOTE — MAU Note (Signed)
Pt reports "extreme cramping" x 2 days, spotting x 2 days.

## 2012-04-18 ENCOUNTER — Ambulatory Visit (HOSPITAL_COMMUNITY): Payer: Medicaid Other

## 2012-04-18 DIAGNOSIS — B373 Candidiasis of vulva and vagina: Secondary | ICD-10-CM | POA: Diagnosis present

## 2012-04-18 LAB — GC/CHLAMYDIA PROBE AMP, GENITAL
Chlamydia, DNA Probe: NEGATIVE
GC Probe Amp, Genital: NEGATIVE

## 2012-04-18 MED ORDER — FLUCONAZOLE 150 MG PO TABS
150.0000 mg | ORAL_TABLET | ORAL | Status: AC
  Administered 2012-04-18: 150 mg via ORAL
  Filled 2012-04-18: qty 1

## 2012-04-21 ENCOUNTER — Encounter (HOSPITAL_COMMUNITY): Payer: Self-pay | Admitting: Obstetrics and Gynecology

## 2012-04-21 ENCOUNTER — Inpatient Hospital Stay (HOSPITAL_COMMUNITY)
Admission: AD | Admit: 2012-04-21 | Discharge: 2012-04-21 | Disposition: A | Discharge status: Home / Self Care | Payer: Medicaid Other | Source: Ambulatory Visit | Attending: Obstetrics & Gynecology | Admitting: Obstetrics & Gynecology

## 2012-04-21 DIAGNOSIS — O99891 Other specified diseases and conditions complicating pregnancy: Secondary | ICD-10-CM | POA: Insufficient documentation

## 2012-04-21 DIAGNOSIS — O9A219 Injury, poisoning and certain other consequences of external causes complicating pregnancy, unspecified trimester: Secondary | ICD-10-CM

## 2012-04-21 DIAGNOSIS — T1490XA Injury, unspecified, initial encounter: Secondary | ICD-10-CM

## 2012-04-21 DIAGNOSIS — W108XXA Fall (on) (from) other stairs and steps, initial encounter: Secondary | ICD-10-CM | POA: Insufficient documentation

## 2012-04-21 MED ORDER — IBUPROFEN 600 MG PO TABS: 600.0000 mg | ORAL_TABLET | Freq: Four times a day (QID) | ORAL | Status: DC | PRN

## 2012-04-21 NOTE — MAU Provider Note (Signed)
Brandi Shaw [id number] @[redacted]w[redacted]d  by LMP Chief Complaint  Patient presents with  . Fall    First Provider Initiated Contact with Patient 04/21/12 1634     SUBJECTIVE HPI: Pt reports tripped and rolled down about 8 steps while rushing. Pt reports she feels mild abd cramping. Denies spotting, bleeding or injuries to other parts of her body. States that fall was an accident and not preceded by syncope or altercation. Plans termination.   Past Medical History  Diagnosis Date  . Asthma   . Abnormal Pap smear 2006    had colposcopy  . Trichimoniasis    Past Surgical History  Procedure Date  . Tonsillectomy   . Colposcopy    History   Social History  . Marital Status: Single    Spouse Name: N/A    Number of Children: N/A  . Years of Education: N/A   Occupational History  . Not on file.   Social History Main Topics  . Smoking status: Former Smoker    Quit date: 03/15/2008  . Smokeless tobacco: Not on file  . Alcohol Use: No  . Drug Use: No  . Sexually Active: Yes   Other Topics Concern  . Not on file   Social History Narrative  . No narrative on file   No current facility-administered medications on file prior to encounter.   Current Outpatient Prescriptions on File Prior to Encounter  Medication Sig Dispense Refill  . albuterol (PROVENTIL,VENTOLIN) 90 MCG/ACT inhaler Inhale 1 puff into the lungs every 6 (six) hours as needed. Shortness of breathe        Allergies  Allergen Reactions  . Banana Hives  . Latex Hives  . Penicillins Hives   ROS: Pertinent items in HPI  OBJECTIVE Blood pressure 123/66, pulse 74, temperature 98.8 F (37.1 C), temperature source Oral, resp. rate 20, height 5' (1.524 m), weight 76.204 kg (168 lb), last menstrual period 01/21/2012.  GENERAL: Well-developed, well-nourished female in no acute distress.  HEENT: Normocephalic, good dentition HEART: normal rate RESP: normal effort ABDOMEN: Soft, nontender EXTREMITIES: Nontender,  no edema NEURO: Alert and oriented SPECULUM EXAM: deferred FHR: 155  LAB RESULTS No results found for this or any previous visit (from the past 24 hour(s)).  IMAGING NA  ASSESSMENT 1. Traumatic injury during pregnancy    PLAN D/C home Discussed comfort measures for the soreness that she should anticipate  Follow-up Information    Follow up with [facility] (for bleeding or abdominal pain)    Contact information:   [address] [place] [address] [phone]      Please follow up.   Contact information:   [facility] [phone]        Medication List  As of 04/21/2012  4:48 PM   TAKE these medications         albuterol 90 MCG/ACT inhaler   Commonly known as: PROVENTIL,VENTOLIN   Inhale 1 puff into the lungs every 6 (six) hours as needed. Shortness of breathe      ibuprofen 600 MG tablet   Commonly known as: ADVIL,MOTRIN   Take 1 tablet (600 mg total) by mouth every 6 (six) hours as needed for pain.           Dorathy Kinsman 04/21/2012 4:30 PM

## 2012-04-21 NOTE — MAU Note (Signed)
Pt reports tripped and rolled down about 8 steps. Pt reports she feels abd cramping. Denies spotting or bleeding.

## 2012-04-22 NOTE — MAU Provider Note (Signed)
Attestation of Attending Supervision of Advanced Practitioner: Evaluation and management procedures were performed by the PA/NP/CNM/OB Fellow under my supervision/collaboration. Chart reviewed and agree with management and plan.  Vennela Jutte V 04/22/2012 6:54 AM

## 2012-04-24 NOTE — MAU Provider Note (Signed)
Attestation of Attending Supervision of Advanced Practitioner (CNM/NP): Evaluation and management procedures were performed by the Advanced Practitioner under my supervision and collaboration.  I have reviewed the Advanced Practitioner's note and chart, and I agree with the management and plan.  HARRAWAY-SMITH, Cailyn Houdek 4:38 PM

## 2012-04-26 ENCOUNTER — Encounter (HOSPITAL_COMMUNITY): Payer: Self-pay | Admitting: *Deleted

## 2012-04-26 ENCOUNTER — Inpatient Hospital Stay (HOSPITAL_COMMUNITY)
Admission: AD | Admit: 2012-04-26 | Discharge: 2012-04-26 | Disposition: A | Discharge status: Home / Self Care | Payer: Medicaid Other | Source: Ambulatory Visit | Attending: Obstetrics and Gynecology | Admitting: Obstetrics and Gynecology

## 2012-04-26 DIAGNOSIS — D649 Anemia, unspecified: Secondary | ICD-10-CM

## 2012-04-26 DIAGNOSIS — O219 Vomiting of pregnancy, unspecified: Secondary | ICD-10-CM

## 2012-04-26 DIAGNOSIS — O210 Mild hyperemesis gravidarum: Secondary | ICD-10-CM

## 2012-04-26 DIAGNOSIS — O99019 Anemia complicating pregnancy, unspecified trimester: Secondary | ICD-10-CM

## 2012-04-26 DIAGNOSIS — S39011A Strain of muscle, fascia and tendon of abdomen, initial encounter: Secondary | ICD-10-CM

## 2012-04-26 DIAGNOSIS — R1013 Epigastric pain: Secondary | ICD-10-CM

## 2012-04-26 LAB — URINALYSIS, ROUTINE W REFLEX MICROSCOPIC
Bilirubin Urine: NEGATIVE
Hgb urine dipstick: NEGATIVE
Ketones, ur: NEGATIVE mg/dL
Nitrite: NEGATIVE
Urobilinogen, UA: 0.2 mg/dL (ref 0.0–1.0)

## 2012-04-26 MED ORDER — PROMETHAZINE HCL 12.5 MG PO TABS: 12.5000 mg | ORAL_TABLET | Freq: Four times a day (QID) | ORAL | Status: AC | PRN

## 2012-04-26 MED ORDER — ONDANSETRON 4 MG PO TBDP
4.0000 mg | ORAL_TABLET | Freq: Once | ORAL | Status: AC
  Administered 2012-04-26: 4 mg via ORAL
  Filled 2012-04-26: qty 1

## 2012-04-26 MED ORDER — PRENATAL PLUS 27-1 MG PO TABS: 1.0000 | ORAL_TABLET | Freq: Every day | ORAL | Status: DC

## 2012-04-26 NOTE — MAU Provider Note (Signed)
Brandi Shaw [id number] @[redacted]w[redacted]d  by LMP Chief Complaint  Patient presents with  . Abdominal Pain     First Provider Initiated Contact with Patient 04/26/12 [phone]      SUBJECTIVE  HPI: Workup with constant gnawing epigastric pain that was somewhat relieved after she vomited. Nauseated now and states she is vomiting every day. Denies heartburn, substernal burning, dysuria, urgency, frequency of urination. No lower abdominal pain. No vaginal bleeding. Seen 6 weeks ago for presumed UTI treated but culture was negative.WP, GC and Chlamydia were then negative. Also concerned about getting into prenatal care. OB history significant for PPROM and preterm birth at 29 weeks.  Past Medical History  Diagnosis Date  . Asthma   . Abnormal Pap smear 2006    had colposcopy  . Trichimoniasis    Past Surgical History  Procedure Date  . Tonsillectomy   . Colposcopy    History   Social History  . Marital Status: Single    Spouse Name: N/A    Number of Children: N/A  . Years of Education: N/A   Occupational History  . Not on file.   Social History Main Topics  . Smoking status: Former Smoker    Quit date: 03/15/2008  . Smokeless tobacco: Not on file  . Alcohol Use: No  . Drug Use: No  . Sexually Active: Yes   Other Topics Concern  . Not on file   Social History Narrative  . No narrative on file   No current facility-administered medications on file prior to encounter.   Current Outpatient Prescriptions on File Prior to Encounter  Medication Sig Dispense Refill  . albuterol (PROVENTIL,VENTOLIN) 90 MCG/ACT inhaler Inhale 1 puff into the lungs every 6 (six) hours as needed. Shortness of breathe       . ibuprofen (ADVIL,MOTRIN) 600 MG tablet Take 1 tablet (600 mg total) by mouth every 6 (six) hours as needed for pain.  30 tablet  1   Allergies  Allergen Reactions  . Banana Hives  . Latex Hives  . Penicillins Hives    ROS: Pertinent items in HPI  OBJECTIVE Blood  pressure 118/74, pulse 89, temperature 98.7 F (37.1 C), temperature source Oral, resp. rate 18, height 5' (1.524 m), weight 76.068 kg (167 lb 11.2 oz), last menstrual period 01/21/2012.  GENERAL: Well-developed, well-nourished female in no acute distress.  HEENT: Normocephalic, good dentition HEART: normal rate RESP: normal effort ABDOMEN: Soft, nontender. FHR 160 EXTREMITIES: Nontender, no edema NEURO: Alert and oriented  LAB RESULTS  Results for orders placed during the hospital encounter of 04/26/12 (from the past 24 hour(s))  URINALYSIS, ROUTINE W REFLEX MICROSCOPIC     Status: Normal   Collection Time   04/26/12  9:47 AM      Component Value Range   Color, Urine YELLOW  YELLOW   APPearance CLEAR  CLEAR   Specific Gravity, Urine 1.020  1.005 - 1.030   pH 8.0  5.0 - 8.0   Glucose, UA NEGATIVE  NEGATIVE mg/dL   Hgb urine dipstick NEGATIVE  NEGATIVE   Bilirubin Urine NEGATIVE  NEGATIVE   Ketones, ur NEGATIVE  NEGATIVE mg/dL   Protein, ur NEGATIVE  NEGATIVE mg/dL   Urobilinogen, UA 0.2  0.0 - 1.0 mg/dL   Nitrite NEGATIVE  NEGATIVE   Leukocytes, UA NEGATIVE  NEGATIVE    MAU COURSE: Declined GI cocktail. Zofran 4 mg SL given. Pain relieved.   ASSESSMENT  1. Abdominal muscle strain   2. Anemia   3. Nausea/vomiting  in pregnancy     PLAN  Medication List  As of 04/26/2012  9:30 AM   ASK your doctor about these medications         albuterol 90 MCG/ACT inhaler   Commonly known as: PROVENTIL,VENTOLIN   Inhale 1 puff into the lungs every 6 (six) hours as needed. Shortness of breathe      ibuprofen 600 MG tablet   Commonly known as: ADVIL,MOTRIN   Take 1 tablet (600 mg total) by mouth every 6 (six) hours as needed for pain.           No Follow-up on file. Note to [facility] to call pt for appt.  Preg. Precautions, verification letter, Rx PNVs given    Laycie Schriner 04/26/2012 9:30 AM

## 2012-04-26 NOTE — MAU Note (Signed)
Pt repots she woke up with a dull upper abd pain had some nausea but not now.

## 2012-04-29 NOTE — MAU Provider Note (Signed)
Agree with above note.  Brandi Shaw 04/29/2012 9:13 AM   

## 2012-05-02 ENCOUNTER — Encounter: Payer: Self-pay | Admitting: *Deleted

## 2012-05-02 ENCOUNTER — Ambulatory Visit (INDEPENDENT_AMBULATORY_CARE_PROVIDER_SITE_OTHER): Payer: Self-pay | Admitting: Family

## 2012-05-02 ENCOUNTER — Encounter: Payer: Self-pay | Admitting: Family

## 2012-05-02 DIAGNOSIS — O0990 Supervision of high risk pregnancy, unspecified, unspecified trimester: Secondary | ICD-10-CM

## 2012-05-02 LAB — POCT URINALYSIS DIP (DEVICE)
Leukocytes, UA: NEGATIVE
Protein, ur: 30 mg/dL — AB
Urobilinogen, UA: 1 mg/dL (ref 0.0–1.0)
pH: 7 (ref 5.0–8.0)

## 2012-05-02 LAB — HIV ANTIBODY (ROUTINE TESTING W REFLEX): HIV: NONREACTIVE

## 2012-05-02 MED ORDER — HYDROXYPROGESTERONE CAPROATE 250 MG/ML IM OIL
250.0000 mg | TOPICAL_OIL | INTRAMUSCULAR | Status: DC
  Administered 2012-05-16 – 2012-05-23 (×2): 250 mg via INTRAMUSCULAR

## 2012-05-02 MED ORDER — INTEGRA F 125-1 MG PO CAPS: 1.0000 | ORAL_CAPSULE | Freq: Every day | ORAL | Status: DC

## 2012-05-02 NOTE — Progress Notes (Signed)
Nutrition Note: 1st consult visit Pt has hx of obesity, preterm delivery, lbw infant, severe N/V, wt loss, anemia. Pt has lost 31.1# @ [redacted]w[redacted]d. Pt reports wt loss has stopped but no wt gain. Pt reports severe N/V, which is improving & is taking Phenegran & PNV. Pt reports eating 1 meal & 2-3 snacks/d. Pt is allergic to bananas. Disc wt gain goals of 11-20# with ~0.5#/wk. Disc healthy nutrition during pregnancy & encouraged energy dense snacks to help with wt gain. Pt agrees to continue PNV & try to eat more often (suggested 3 meals & 2-3 snacks). Pt does not currently receive WIC services but plans to apply soon. Pt plans to BF. F/u in 4-6 wks Blondell Reveal, MS, RD, LDN

## 2012-05-02 NOTE — Progress Notes (Signed)
Pulse:81 Pt has some cramping.  Pt couldn't tolerate 1hr gtt she vomited glucola.

## 2012-05-02 NOTE — Progress Notes (Signed)
New OB appointment today; reviewed OB history, history of preterm delivery, offered 17p, will begin next week.  Offered genetic screening, will do quad screen.  OB panel, pap smear obtained today.    Exam    Uterine Size: size equals dates  Pelvic Exam:    Perineum: No Hemorrhoids, Normal Perineum   Vulva: normal   Vagina:  normal mucosa, normal discharge, no palpable nodules   pH: Not done   Cervix: no bleeding following Pap, no cervical motion tenderness and no lesions   Adnexa: normal adnexa and no mass, fullness, tenderness   Bony Pelvis: Adequate  System: Breast:  No nipple retraction or dimpling, No nipple discharge or bleeding, No axillary or supraclavicular adenopathy, Normal to palpation without dominant masses   Skin: normal coloration and turgor, no rashes    Neurologic: negative   Extremities: normal strength, tone, and muscle mass   HEENT neck supple with midline trachea and thyroid without masses   Mouth/Teeth mucous membranes moist, pharynx normal without lesions   Neck supple and no masses   Cardiovascular: regular rate and rhythm, no murmurs or gallops   Respiratory:  appears well, vitals normal, no respiratory distress, acyanotic, normal RR, neck free of mass or lymphadenopathy, chest clear, no wheezing, crepitations, rhonchi, normal symmetric air entry   Abdomen: soft, non-tender; bowel sounds normal; no masses,  no organomegaly   Urinary: urethral meatus normal

## 2012-05-03 LAB — ABO

## 2012-05-03 LAB — RH TYPE: Rh Type: POSITIVE

## 2012-05-03 LAB — ANTIBODY SCREEN: Antibody Screen: NEGATIVE

## 2012-05-04 LAB — CULTURE, OB URINE: Colony Count: 50000

## 2012-05-06 LAB — HEMOGLOBINOPATHY EVALUATION
Hemoglobin Other: 0 %
Hgb A2 Quant: 2.7 % (ref 2.2–3.2)
Hgb A: 95.2 % — ABNORMAL LOW (ref 96.8–97.8)
Hgb F Quant: 2.1 % — ABNORMAL HIGH (ref 0.0–2.0)
Hgb S Quant: 0 %

## 2012-05-08 ENCOUNTER — Ambulatory Visit: Payer: Self-pay | Admitting: Obstetrics and Gynecology

## 2012-05-16 ENCOUNTER — Encounter: Payer: Self-pay | Admitting: Physician Assistant

## 2012-05-16 ENCOUNTER — Ambulatory Visit (INDEPENDENT_AMBULATORY_CARE_PROVIDER_SITE_OTHER): Payer: Self-pay | Admitting: General Practice

## 2012-05-16 VITALS — BP 109/62 | HR 64 | Temp 97.5°F | Ht 60.0 in | Wt 166.9 lb

## 2012-05-16 DIAGNOSIS — O09219 Supervision of pregnancy with history of pre-term labor, unspecified trimester: Secondary | ICD-10-CM

## 2012-05-23 ENCOUNTER — Ambulatory Visit (INDEPENDENT_AMBULATORY_CARE_PROVIDER_SITE_OTHER): Payer: Self-pay | Admitting: Obstetrics and Gynecology

## 2012-05-23 ENCOUNTER — Encounter: Payer: Self-pay | Admitting: Obstetrics and Gynecology

## 2012-05-23 VITALS — BP 105/56 | Temp 97.5°F | Wt 166.6 lb

## 2012-05-23 DIAGNOSIS — B373 Candidiasis of vulva and vagina: Secondary | ICD-10-CM

## 2012-05-23 DIAGNOSIS — O09219 Supervision of pregnancy with history of pre-term labor, unspecified trimester: Secondary | ICD-10-CM

## 2012-05-23 LAB — POCT URINALYSIS DIP (DEVICE)
Glucose, UA: NEGATIVE mg/dL
Nitrite: NEGATIVE
Protein, ur: 30 mg/dL — AB
Specific Gravity, Urine: 1.015 (ref 1.005–1.030)
Urobilinogen, UA: 2 mg/dL — ABNORMAL HIGH (ref 0.0–1.0)

## 2012-05-23 NOTE — Progress Notes (Signed)
U/S scheduled 06/06/12 at 1030 am.

## 2012-05-23 NOTE — Progress Notes (Signed)
P= 82 Pain - feels like cramping in lower abdomen

## 2012-05-23 NOTE — Patient Instructions (Signed)

## 2012-05-23 NOTE — Progress Notes (Signed)
17P today. Anatomy US scheduled.  Counseled and declines quad screen. Cramping no different than throughout pregnancy.

## 2012-05-30 ENCOUNTER — Ambulatory Visit (INDEPENDENT_AMBULATORY_CARE_PROVIDER_SITE_OTHER): Payer: Self-pay

## 2012-05-30 ENCOUNTER — Encounter: Payer: Self-pay | Admitting: Obstetrics and Gynecology

## 2012-05-30 VITALS — BP 118/69 | HR 87 | Wt 165.9 lb

## 2012-05-30 DIAGNOSIS — O09219 Supervision of pregnancy with history of pre-term labor, unspecified trimester: Secondary | ICD-10-CM

## 2012-05-30 LAB — POCT URINALYSIS DIP (DEVICE)
Glucose, UA: NEGATIVE mg/dL
Hgb urine dipstick: NEGATIVE
Nitrite: NEGATIVE
Urobilinogen, UA: 1 mg/dL (ref 0.0–1.0)
pH: 7 (ref 5.0–8.0)

## 2012-05-30 MED ORDER — HYDROXYPROGESTERONE CAPROATE 250 MG/ML IM OIL
250.0000 mg | TOPICAL_OIL | INTRAMUSCULAR | Status: DC
  Administered 2012-05-30: 250 mg via INTRAMUSCULAR

## 2012-06-06 ENCOUNTER — Ambulatory Visit (HOSPITAL_COMMUNITY)
Admission: RE | Admit: 2012-06-06 | Discharge: 2012-06-06 | Disposition: A | Discharge status: Home / Self Care | Payer: Medicaid Other | Source: Ambulatory Visit | Attending: Obstetrics and Gynecology | Admitting: Obstetrics and Gynecology

## 2012-06-06 ENCOUNTER — Ambulatory Visit (INDEPENDENT_AMBULATORY_CARE_PROVIDER_SITE_OTHER): Payer: Self-pay | Admitting: *Deleted

## 2012-06-06 VITALS — BP 119/68 | HR 86 | Temp 97.6°F

## 2012-06-06 DIAGNOSIS — O09219 Supervision of pregnancy with history of pre-term labor, unspecified trimester: Secondary | ICD-10-CM

## 2012-06-06 DIAGNOSIS — Z3689 Encounter for other specified antenatal screening: Secondary | ICD-10-CM | POA: Insufficient documentation

## 2012-06-06 MED ORDER — HYDROXYPROGESTERONE CAPROATE 250 MG/ML IM OIL
250.0000 mg | TOPICAL_OIL | INTRAMUSCULAR | Status: DC
  Administered 2012-06-06: 250 mg via INTRAMUSCULAR

## 2012-06-09 ENCOUNTER — Inpatient Hospital Stay (HOSPITAL_COMMUNITY)
Admission: AD | Admit: 2012-06-09 | Discharge: 2012-06-09 | Disposition: A | Discharge status: Home / Self Care | Payer: Medicaid Other | Source: Ambulatory Visit | Attending: Obstetrics and Gynecology | Admitting: Obstetrics and Gynecology

## 2012-06-09 ENCOUNTER — Encounter (HOSPITAL_COMMUNITY): Payer: Self-pay | Admitting: *Deleted

## 2012-06-09 DIAGNOSIS — B3731 Acute candidiasis of vulva and vagina: Secondary | ICD-10-CM | POA: Insufficient documentation

## 2012-06-09 DIAGNOSIS — O2390 Unspecified genitourinary tract infection in pregnancy, unspecified trimester: Secondary | ICD-10-CM | POA: Insufficient documentation

## 2012-06-09 DIAGNOSIS — O0990 Supervision of high risk pregnancy, unspecified, unspecified trimester: Secondary | ICD-10-CM

## 2012-06-09 DIAGNOSIS — B373 Candidiasis of vulva and vagina: Secondary | ICD-10-CM

## 2012-06-09 LAB — URINALYSIS, ROUTINE W REFLEX MICROSCOPIC
Bilirubin Urine: NEGATIVE
Hgb urine dipstick: NEGATIVE
Nitrite: NEGATIVE
Protein, ur: NEGATIVE mg/dL
Specific Gravity, Urine: 1.02 (ref 1.005–1.030)
Urobilinogen, UA: 0.2 mg/dL (ref 0.0–1.0)

## 2012-06-09 LAB — WET PREP, GENITAL
Trich, Wet Prep: NONE SEEN
Yeast Wet Prep HPF POC: NONE SEEN

## 2012-06-09 LAB — URINE MICROSCOPIC-ADD ON

## 2012-06-09 MED ORDER — TERCONAZOLE 0.4 % VA CREA: 1.0000 | TOPICAL_CREAM | Freq: Every day | VAGINAL | Status: DC

## 2012-06-09 NOTE — Progress Notes (Signed)
Brandi Shaw is  21 y.o. [phone] at [redacted]w[redacted]d presents complaining of itching and irritation around the vaginal opening over the last three days. She has noticed a thick, white, clumpy discharge similar to what she had experienced a month ago in which she was treated for yeast. She reports +FM, and denies cramping, VB, or LOF. She is seeking care in [facility] for a hx of PTD at 33 wks and is receiving weekly 17-P injections.  Obstetrical/Gynecological History: Menstrual History: OB History    Grav Para Term Preterm Abortions TAB SAB Ect Mult Living   4 1 0 1 2 1 1 0 0 1        Patient's last menstrual period was 01/21/2012.     Past Medical History: Past Medical History  Diagnosis Date  . Trichimoniasis   . Anemia   . Abnormal Pap smear 2006    had colposcopy>nml  . Asthma     Albuterol as needed    Past Surgical History: Past Surgical History  Procedure Date  . Tonsillectomy   . Colposcopy     Family History: Family History  Problem Relation Age of Onset  . Diabetes Mother   . Drug abuse Mother     crack  . Other Neg Hx   . Diabetes Maternal Grandmother     Social History: History  Substance Use Topics  . Smoking status: Former Smoker    Quit date: 03/15/2008  . Smokeless tobacco: Not on file  . Alcohol Use: No    Allergies:  Allergies  Allergen Reactions  . Banana Hives  . Latex Hives  . Penicillins Hives    Meds:  Prescriptions prior to admission  Medication Sig Dispense Refill  . Fe Fum-FePoly-FA-Vit C-Vit B3 (INTEGRA F) 125-1 MG CAPS Take 1 tablet by mouth daily.  30 capsule  1  . albuterol (PROVENTIL,VENTOLIN) 90 MCG/ACT inhaler Inhale 1 puff into the lungs every 6 (six) hours as needed. Shortness of breathe         Review of Systems - Please refer to the aforementioned patients' reports.     Physical Exam  Blood pressure 122/61, pulse 79, temperature 98.1 F (36.7 C), temperature source Oral, resp. rate 18, height 5' (1.524 m), weight 75.751  kg (167 lb), last menstrual period 01/21/2012, unknown if currently breastfeeding. GENERAL: Well-developed, well-nourished female in no acute distress.  LUNGS: Clear to auscultation bilaterally.  HEART: Regular rate and rhythm. ABDOMEN: Soft, nontender, nondistended, gravid.  EXTREMITIES: Nontender, no edema, 2+ distal pulses. SPECULUM EXAM: thick, white, clumpy, non-odorous discharge   Labs: Recent Results (from the past 24 hour(s))  URINALYSIS, ROUTINE W REFLEX MICROSCOPIC   Collection Time   06/09/12  1:14 PM      Component Value Range   Color, Urine YELLOW  YELLOW   APPearance CLEAR  CLEAR   Specific Gravity, Urine 1.020  1.005 - 1.030   pH 7.0  5.0 - 8.0   Glucose, UA NEGATIVE  NEGATIVE mg/dL   Hgb urine dipstick NEGATIVE  NEGATIVE   Bilirubin Urine NEGATIVE  NEGATIVE   Ketones, ur NEGATIVE  NEGATIVE mg/dL   Protein, ur NEGATIVE  NEGATIVE mg/dL   Urobilinogen, UA 0.2  0.0 - 1.0 mg/dL   Nitrite NEGATIVE  NEGATIVE   Leukocytes, UA MODERATE (*) NEGATIVE  URINE MICROSCOPIC-ADD ON   Collection Time   06/09/12  1:14 PM      Component Value Range   Squamous Epithelial / LPF MANY (*) RARE   WBC, UA 7-10  <  3 WBC/hpf   RBC / HPF 0-2  <3 RBC/hpf   Bacteria, UA MANY (*) RARE  WET PREP, GENITAL   Collection Time   06/09/12  3:00 PM      Component Value Range   Yeast Wet Prep HPF POC NONE SEEN  NONE SEEN   Trich, Wet Prep NONE SEEN  NONE SEEN   Clue Cells Wet Prep HPF POC NONE SEEN  NONE SEEN   WBC, Wet Prep HPF POC FEW (*) NONE SEEN    Assessment: IUP@[redacted]w[redacted]d  Vaginal candida  Plan: Discharge home, terazol-7, f/u as scheduled in [facility].   Lawernce Pitts 9/22/20133:59 PM

## 2012-06-09 NOTE — MAU Note (Signed)
Pt reports she thinks she has a yeast infection. Using vagaisil without much releif

## 2012-06-09 NOTE — Progress Notes (Signed)
I was present for the exam and agree with above.  New Shanyn Preisler, CNM 06/09/2012 4:11 PM

## 2012-06-13 ENCOUNTER — Ambulatory Visit (INDEPENDENT_AMBULATORY_CARE_PROVIDER_SITE_OTHER): Payer: Self-pay | Admitting: Medical

## 2012-06-13 DIAGNOSIS — O09219 Supervision of pregnancy with history of pre-term labor, unspecified trimester: Secondary | ICD-10-CM

## 2012-06-13 MED ORDER — HYDROXYPROGESTERONE CAPROATE 250 MG/ML IM OIL
250.0000 mg | TOPICAL_OIL | INTRAMUSCULAR | Status: DC
  Administered 2012-06-13: 250 mg via INTRAMUSCULAR

## 2012-06-20 ENCOUNTER — Encounter: Payer: Self-pay | Admitting: Family Medicine

## 2012-06-20 ENCOUNTER — Ambulatory Visit (INDEPENDENT_AMBULATORY_CARE_PROVIDER_SITE_OTHER): Payer: Self-pay | Admitting: Family Medicine

## 2012-06-20 VITALS — BP 115/72 | Temp 98.1°F | Wt 166.6 lb

## 2012-06-20 DIAGNOSIS — O09219 Supervision of pregnancy with history of pre-term labor, unspecified trimester: Secondary | ICD-10-CM

## 2012-06-20 DIAGNOSIS — Z23 Encounter for immunization: Secondary | ICD-10-CM

## 2012-06-20 DIAGNOSIS — B373 Candidiasis of vulva and vagina: Secondary | ICD-10-CM

## 2012-06-20 DIAGNOSIS — O0990 Supervision of high risk pregnancy, unspecified, unspecified trimester: Secondary | ICD-10-CM

## 2012-06-20 LAB — POCT URINALYSIS DIP (DEVICE)
Bilirubin Urine: NEGATIVE
Glucose, UA: NEGATIVE mg/dL
Nitrite: NEGATIVE
Urobilinogen, UA: 0.2 mg/dL (ref 0.0–1.0)

## 2012-06-20 MED ORDER — HYDROXYPROGESTERONE CAPROATE 250 MG/ML IM OIL
250.0000 mg | TOPICAL_OIL | Freq: Once | INTRAMUSCULAR | Status: AC
  Administered 2012-06-20: 250 mg via INTRAMUSCULAR

## 2012-06-20 MED ORDER — INFLUENZA VIRUS VACC SPLIT PF IM SUSP
0.5000 mL | Freq: Once | INTRAMUSCULAR | Status: AC
  Administered 2012-06-20: 0.5 mL via INTRAMUSCULAR

## 2012-06-20 NOTE — Progress Notes (Signed)
Flu shot and 17 P today Anatomy reviewed-EIF only-pt. Does not want genetic screening.

## 2012-06-20 NOTE — Patient Instructions (Addendum)
Pregnancy - Second Trimester The second trimester of pregnancy (3 to 6 months) is a period of rapid growth for you and your baby. At the end of the sixth month, your baby is about 9 inches long and weighs 1 1/2 pounds. You will begin to feel the baby move between 18 and 20 weeks of the pregnancy. This is called quickening. Weight gain is faster. A clear fluid (colostrum) may leak out of your breasts. You may feel small contractions of the womb (uterus). This is known as false labor or Braxton-Hicks contractions. This is like a practice for labor when the baby is ready to be born. Usually, the problems with morning sickness have usually passed by the end of your first trimester. Some women develop small dark blotches (called cholasma, mask of pregnancy) on their face that usually goes away after the baby is born. Exposure to the sun makes the blotches worse. Acne may also develop in some pregnant women and pregnant women who have acne, may find that it goes away. PRENATAL EXAMS  Blood work may continue to be done during prenatal exams. These tests are done to check on your health and the probable health of your baby. Blood work is used to follow your blood levels (hemoglobin). Anemia (low hemoglobin) is common during pregnancy. Iron and vitamins are given to help prevent this. You will also be checked for diabetes between 24 and 28 weeks of the pregnancy. Some of the previous blood tests may be repeated.  The size of the uterus is measured during each visit. This is to make sure that the baby is continuing to grow properly according to the dates of the pregnancy.  Your blood pressure is checked every prenatal visit. This is to make sure you are not getting toxemia.  Your urine is checked to make sure you do not have an infection, diabetes or protein in the urine.  Your weight is checked often to make sure gains are happening at the suggested rate. This is to ensure that both you and your baby are  growing normally.  Sometimes, an ultrasound is performed to confirm the proper growth and development of the baby. This is a test which bounces harmless sound waves off the baby so your caregiver can more accurately determine due dates. Sometimes, a specialized test is done on the amniotic fluid surrounding the baby. This test is called an amniocentesis. The amniotic fluid is obtained by sticking a needle into the belly (abdomen). This is done to check the chromosomes in instances where there is a concern about possible genetic problems with the baby. It is also sometimes done near the end of pregnancy if an early delivery is required. In this case, it is done to help make sure the baby's lungs are mature enough for the baby to live outside of the womb. CHANGES OCCURING IN THE SECOND TRIMESTER OF PREGNANCY Your body goes through many changes during pregnancy. They vary from person to person. Talk to your caregiver about changes you notice that you are concerned about.  During the second trimester, you will likely have an increase in your appetite. It is normal to have cravings for certain foods. This varies from person to person and pregnancy to pregnancy.  Your lower abdomen will begin to bulge.  You may have to urinate more often because the uterus and baby are pressing on your bladder. It is also common to get more bladder infections during pregnancy (pain with urination). You can help this by   drinking lots of fluids and emptying your bladder before and after intercourse.  You may begin to get stretch marks on your hips, abdomen, and breasts. These are normal changes in the body during pregnancy. There are no exercises or medications to take that prevent this change.  You may begin to develop swollen and bulging veins (varicose veins) in your legs. Wearing support hose, elevating your feet for 15 minutes, 3 to 4 times a day and limiting salt in your diet helps lessen the problem.  Heartburn may  develop as the uterus grows and pushes up against the stomach. Antacids recommended by your caregiver helps with this problem. Also, eating smaller meals 4 to 5 times a day helps.  Constipation can be treated with a stool softener or adding bulk to your diet. Drinking lots of fluids, vegetables, fruits, and whole grains are helpful.  Exercising is also helpful. If you have been very active up until your pregnancy, most of these activities can be continued during your pregnancy. If you have been less active, it is helpful to start an exercise program such as walking.  Hemorrhoids (varicose veins in the rectum) may develop at the end of the second trimester. Warm sitz baths and hemorrhoid cream recommended by your caregiver helps hemorrhoid problems.  Backaches may develop during this time of your pregnancy. Avoid heavy lifting, wear low heal shoes and practice good posture to help with backache problems.  Some pregnant women develop tingling and numbness of their hand and fingers because of swelling and tightening of ligaments in the wrist (carpel tunnel syndrome). This goes away after the baby is born.  As your breasts enlarge, you may have to get a bigger bra. Get a comfortable, cotton, support bra. Do not get a nursing bra until the last month of the pregnancy if you will be nursing the baby.  You may get a dark line from your belly button to the pubic area called the linea nigra.  You may develop rosy cheeks because of increase blood flow to the face.  You may develop spider looking lines of the face, neck, arms and chest. These go away after the baby is born. HOME CARE INSTRUCTIONS   It is extremely important to avoid all smoking, herbs, alcohol, and unprescribed drugs during your pregnancy. These chemicals affect the formation and growth of the baby. Avoid these chemicals throughout the pregnancy to ensure the delivery of a healthy infant.  Most of your home care instructions are the same  as suggested for the first trimester of your pregnancy. Keep your caregiver's appointments. Follow your caregiver's instructions regarding medication use, exercise and diet.  During pregnancy, you are providing food for you and your baby. Continue to eat regular, well-balanced meals. Choose foods such as meat, fish, milk and other low fat dairy products, vegetables, fruits, and whole-grain breads and cereals. Your caregiver will tell you of the ideal weight gain.  A physical sexual relationship may be continued up until near the end of pregnancy if there are no other problems. Problems could include early (premature) leaking of amniotic fluid from the membranes, vaginal bleeding, abdominal pain, or other medical or pregnancy problems.  Exercise regularly if there are no restrictions. Check with your caregiver if you are unsure of the safety of some of your exercises. The greatest weight gain will occur in the last 2 trimesters of pregnancy. Exercise will help you:  Control your weight.  Get you in shape for labor and delivery.  Lose weight   after you have the baby.  Wear a good support or jogging bra for breast tenderness during pregnancy. This may help if worn during sleep. Pads or tissues may be used in the bra if you are leaking colostrum.  Do not use hot tubs, steam rooms or saunas throughout the pregnancy.  Wear your seat belt at all times when driving. This protects you and your baby if you are in an accident.  Avoid raw meat, uncooked cheese, cat litter boxes and soil used by cats. These carry germs that can cause birth defects in the baby.  The second trimester is also a good time to visit your dentist for your dental health if this has not been done yet. Getting your teeth cleaned is OK. Use a soft toothbrush. Brush gently during pregnancy.  It is easier to loose urine during pregnancy. Tightening up and strengthening the pelvic muscles will help with this problem. Practice stopping  your urination while you are going to the bathroom. These are the same muscles you need to strengthen. It is also the muscles you would use as if you were trying to stop from passing gas. You can practice tightening these muscles up 10 times a set and repeating this about 3 times per day. Once you know what muscles to tighten up, do not perform these exercises during urination. It is more likely to contribute to an infection by backing up the urine.  Ask for help if you have financial, counseling or nutritional needs during pregnancy. Your caregiver will be able to offer counseling for these needs as well as refer you for other special needs.  Your skin may become oily. If so, wash your face with mild soap, use non-greasy moisturizer and oil or cream based makeup. MEDICATIONS AND DRUG USE IN PREGNANCY  Take prenatal vitamins as directed. The vitamin should contain 1 milligram of folic acid. Keep all vitamins out of reach of children. Only a couple vitamins or tablets containing iron may be fatal to a baby or young child when ingested.  Avoid use of all medications, including herbs, over-the-counter medications, not prescribed or suggested by your caregiver. Only take over-the-counter or prescription medicines for pain, discomfort, or fever as directed by your caregiver. Do not use aspirin.  Let your caregiver also know about herbs you may be using.  Alcohol is related to a number of birth defects. This includes fetal alcohol syndrome. All alcohol, in any form, should be avoided completely. Smoking will cause low birth rate and premature babies.  Street or illegal drugs are very harmful to the baby. They are absolutely forbidden. A baby born to an addicted mother will be addicted at birth. The baby will go through the same withdrawal an adult does. SEEK MEDICAL CARE IF:  You have any concerns or worries during your pregnancy. It is better to call with your questions if you feel they cannot wait,  rather than worry about them. SEEK IMMEDIATE MEDICAL CARE IF:   An unexplained oral temperature above 102 F (38.9 C) develops, or as your caregiver suggests.  You have leaking of fluid from the vagina (birth canal). If leaking membranes are suspected, take your temperature and tell your caregiver of this when you call.  There is vaginal spotting, bleeding, or passing clots. Tell your caregiver of the amount and how many pads are used. Light spotting in pregnancy is common, especially following intercourse.  You develop a bad smelling vaginal discharge with a change in the color from clear   to white.  You continue to feel sick to your stomach (nauseated) and have no relief from remedies suggested. You vomit blood or coffee ground-like materials.  You lose more than 2 pounds of weight or gain more than 2 pounds of weight over 1 week, or as suggested by your caregiver.  You notice swelling of your face, hands, feet, or legs.  You get exposed to [place] measles and have never had them.  You are exposed to fifth disease or chickenpox.  You develop belly (abdominal) pain. Round ligament discomfort is a common non-cancerous (benign) cause of abdominal pain in pregnancy. Your caregiver still must evaluate you.  You develop a bad headache that does not go away.  You develop fever, diarrhea, pain with urination, or shortness of breath.  You develop visual problems, blurry, or double vision.  You fall or are in a car accident or any kind of trauma.  There is mental or physical violence at home. Document Released: 08/29/2001 Document Revised: 11/27/2011 Document Reviewed: 03/03/2009 [facility] Patient Information 2013 [place], [place].  Contraception Choices Contraception (birth control) is the use of any methods or devices to prevent pregnancy. Below are some methods to help avoid pregnancy. HORMONAL METHODS   Contraceptive implant. This is a thin, plastic tube containing progesterone  hormone. It does not contain estrogen hormone. Your caregiver inserts the tube in the inner part of the upper arm. The tube can remain in place for up to 3 years. After 3 years, the implant must be removed. The implant prevents the ovaries from releasing an egg (ovulation), thickens the cervical mucus which prevents sperm from entering the uterus, and thins the lining of the inside of the uterus.  Progesterone-only injections. These injections are given every 3 months by your caregiver to prevent pregnancy. This synthetic progesterone hormone stops the ovaries from releasing eggs. It also thickens cervical mucus and changes the uterine lining. This makes it harder for sperm to survive in the uterus.  Birth control pills. These pills contain estrogen and progesterone hormone. They work by stopping the egg from forming in the ovary (ovulation). Birth control pills are prescribed by a caregiver.Birth control pills can also be used to treat heavy periods.  Minipill. This type of birth control pill contains only the progesterone hormone. They are taken every day of each month and must be prescribed by your caregiver.  Birth control patch. The patch contains hormones similar to those in birth control pills. It must be changed once a week and is prescribed by a caregiver.  Vaginal ring. The ring contains hormones similar to those in birth control pills. It is left in the vagina for 3 weeks, removed for 1 week, and then a new one is put back in place. The patient must be comfortable inserting and removing the ring from the vagina.A caregiver's prescription is necessary.  Emergency contraception. Emergency contraceptives prevent pregnancy after unprotected sexual intercourse. This pill can be taken right after sex or up to 5 days after unprotected sex. It is most effective the sooner you take the pills after having sexual intercourse. Emergency contraceptive pills are available without a prescription. Check  with your pharmacist. Do not use emergency contraception as your only form of birth control. BARRIER METHODS   Female condom. This is a thin sheath (latex or rubber) that is worn over the penis during sexual intercourse. It can be used with spermicide to increase effectiveness.  Female condom. This is a soft, loose-fitting sheath that is put into the  vagina before sexual intercourse.  Diaphragm. This is a soft, latex, dome-shaped barrier that must be fitted by a caregiver. It is inserted into the vagina, along with a spermicidal jelly. It is inserted before intercourse. The diaphragm should be left in the vagina for 6 to 8 hours after intercourse.  Cervical cap. This is a round, soft, latex or plastic cup that fits over the cervix and must be fitted by a caregiver. The cap can be left in place for up to 48 hours after intercourse.  Sponge. This is a soft, circular piece of polyurethane foam. The sponge has spermicide in it. It is inserted into the vagina after wetting it and before sexual intercourse.  Spermicides. These are chemicals that kill or block sperm from entering the cervix and uterus. They come in the form of creams, jellies, suppositories, foam, or tablets. They do not require a prescription. They are inserted into the vagina with an applicator before having sexual intercourse. The process must be repeated every time you have sexual intercourse. INTRAUTERINE CONTRACEPTION  Intrauterine device (IUD). This is a T-shaped device that is put in a woman's uterus during a menstrual period to prevent pregnancy. There are 2 types:  Copper IUD. This type of IUD is wrapped in copper wire and is placed inside the uterus. Copper makes the uterus and fallopian tubes produce a fluid that kills sperm. It can stay in place for 10 years.  Hormone IUD. This type of IUD contains the hormone progestin (synthetic progesterone). The hormone thickens the cervical mucus and prevents sperm from entering the  uterus, and it also thins the uterine lining to prevent implantation of a fertilized egg. The hormone can weaken or kill the sperm that get into the uterus. It can stay in place for 5 years. PERMANENT METHODS OF CONTRACEPTION  Female tubal ligation. This is when the woman's fallopian tubes are surgically sealed, tied, or blocked to prevent the egg from traveling to the uterus.  Female sterilization. This is when the female has the tubes that carry sperm tied off (vasectomy).This blocks sperm from entering the vagina during sexual intercourse. After the procedure, the man can still ejaculate fluid (semen). NATURAL PLANNING METHODS  Natural family planning. This is not having sexual intercourse or using a barrier method (condom, diaphragm, cervical cap) on days the woman could become pregnant.  Calendar method. This is keeping track of the length of each menstrual cycle and identifying when you are fertile.  Ovulation method. This is avoiding sexual intercourse during ovulation.  Symptothermal method. This is avoiding sexual intercourse during ovulation, using a thermometer and ovulation symptoms.  Post-ovulation method. This is timing sexual intercourse after you have ovulated. Regardless of which type or method of contraception you choose, it is important that you use condoms to protect against the transmission of sexually transmitted diseases (STDs). Talk with your caregiver about which form of contraception is most appropriate for you. Document Released: 09/04/2005 Document Revised: 11/27/2011 Document Reviewed: 01/11/2011 [facility] Patient Information 2013 [place], [place].  Breastfeeding Deciding to breastfeed is one of the best choices you can make for you and your baby. The information that follows gives a brief overview of the benefits of breastfeeding as well as common topics surrounding breastfeeding. BENEFITS OF BREASTFEEDING For the baby  The first milk (colostrum) helps the baby's  digestive system function better.   There are antibodies in the mother's milk that help the baby fight off infections.   The baby has a lower incidence of asthma,  allergies, and sudden infant death syndrome (SIDS).   The nutrients in breast milk are better for the baby than infant formulas, and breast milk helps the baby's brain grow better.   Babies who breastfeed have less gas, colic, and constipation.  For the mother  Breastfeeding helps develop a very special bond between the mother and her baby.   Breastfeeding is convenient, always available at the correct temperature, and costs nothing.   Breastfeeding burns calories in the mother and helps her lose weight that was gained during pregnancy.   Breastfeeding makes the uterus contract back down to normal size faster and slows bleeding following delivery.   Breastfeeding mothers have a lower risk of developing breast cancer.  BREASTFEEDING FREQUENCY  A healthy, full-term baby may breastfeed as often as every hour or space his or her feedings to every 3 hours.   Watch your baby for signs of hunger. Nurse your baby if he or she shows signs of hunger. How often you nurse will vary from baby to baby.   Nurse as often as the baby requests, or when you feel the need to reduce the fullness of your breasts.   Awaken the baby if it has been 3 4 hours since the last feeding.   Frequent feeding will help the mother make more milk and will help prevent problems, such as sore nipples and engorgement of the breasts.  BABY'S POSITION AT THE BREAST  Whether lying down or sitting, be sure that the baby's tummy is facing your tummy.   Support the breast with 4 fingers underneath the breast and the thumb above. Make sure your fingers are well away from the nipple and baby's mouth.   Stroke the baby's lips gently with your finger or nipple.   When the baby's mouth is open wide enough, place all of your nipple and as much of the  [place] as possible into your baby's mouth.   Pull the baby in close so the tip of the nose and the baby's cheeks touch the breast during the feeding.  FEEDINGS AND SUCTION  The length of each feeding varies from baby to baby and from feeding to feeding.   The baby must suck about 2 3 minutes for your milk to get to him or her. This is called a "let down." For this reason, allow the baby to feed on each breast as long as he or she wants. Your baby will end the feeding when he or she has received the right balance of nutrients.   To break the suction, put your finger into the corner of the baby's mouth and slide it between his or her gums before removing your breast from his or her mouth. This will help prevent sore nipples.  HOW TO TELL WHETHER YOUR BABY IS GETTING ENOUGH BREAST MILK. Wondering whether or not your baby is getting enough milk is a common concern among mothers. You can be assured that your baby is getting enough milk if:   Your baby is actively sucking and you hear swallowing.   Your baby seems relaxed and satisfied after a feeding.   Your baby nurses at least 8 12 times in a 24 hour time period. Nurse your baby until he or she unlatches or falls asleep at the first breast (at least 10 20 minutes), then offer the second side.   Your baby is wetting 5 6 disposable diapers (6 8 cloth diapers) in a 24 hour period by 38 49 days of age.  Your baby is having at least 3 4 stools every 24 hours for the first 6 weeks. The stool should be soft and yellow.   Your baby should gain 4 7 ounces per week after he or she is 4 days old.   Your breasts feel softer after nursing.  REDUCING BREAST ENGORGEMENT  In the first week after your baby is born, you may experience signs of breast engorgement. When breasts are engorged, they feel heavy, warm, full, and may be tender to the touch. You can reduce engorgement if you:   Nurse frequently, every 2 3 hours. Mothers who breastfeed  early and often have fewer problems with engorgement.   Place light ice packs on your breasts for 10 20 minutes between feedings. This reduces swelling. Wrap the ice packs in a lightweight towel to protect your skin. Bags of frozen vegetables work well for this purpose.   Take a warm shower or apply warm, moist heat to your breast for 5 10 minutes just before each feeding. This increases circulation and helps the milk flow.   Gently massage your breast before and during the feeding. Using your finger tips, massage from the chest wall towards your nipple in a circular motion.   Make sure that the baby empties at least one breast at every feeding before switching sides.   Use a breast pump to empty the breasts if your baby is sleepy or not nursing well. You may also want to pump if you are returning to work oryou feel you are getting engorged.   Avoid bottle feeds, pacifiers, or supplemental feedings of water or juice in place of breastfeeding. Breast milk is all the food your baby needs. It is not necessary for your baby to have water or formula. In fact, to help your breasts make more milk, it is best not to give your baby supplemental feedings during the early weeks.   Be sure the baby is latched on and positioned properly while breastfeeding.   Wear a supportive bra, avoiding underwire styles.   Eat a balanced diet with enough fluids.   Rest often, relax, and take your prenatal vitamins to prevent fatigue, stress, and anemia.  If you follow these suggestions, your engorgement should improve in 24 48 hours. If you are still experiencing difficulty, call your lactation consultant or caregiver.  CARING FOR YOURSELF Take care of your breasts  Bathe or shower daily.   Avoid using soap on your nipples.   Start feedings on your left breast at one feeding and on your right breast at the next feeding.   You will notice an increase in your milk supply 2 5 days after delivery.  You may feel some discomfort from engorgement, which makes your breasts very firm and often tender. Engorgement "peaks" out within 24 48 hours. In the meantime, apply warm moist towels to your breasts for 5 10 minutes before feeding. Gentle massage and expression of some milk before feeding will soften your breasts, making it easier for your baby to latch on.   Wear a well-fitting nursing bra, and air dry your nipples for a 3 4minutes after each feeding.   Only use cotton bra pads.   Only use pure lanolin on your nipples after nursing. You do not need to wash it off before feeding the baby again. Another option is to express a few drops of breast milk and gently massage it into your nipples.  Take care of yourself  Eat well-balanced meals and nutritious snacks.     Drinking milk, fruit juice, and water to satisfy your thirst (about 8 glasses a day).   Get plenty of rest.  Avoid foods that you notice affect the baby in a bad way.  SEEK MEDICAL CARE IF:   You have difficulty with breastfeeding and need help.   You have a hard, red, sore area on your breast that is accompanied by a fever.   Your baby is too sleepy to eat well or is having trouble sleeping.   Your baby is wetting less than 6 diapers a day, by 17 days of age.   Your baby's skin or white part of his or her eyes is more yellow than it was in the hospital.   You feel depressed.  Document Released: 09/04/2005 Document Revised: 03/05/2012 Document Reviewed: 12/03/2011 [facility] Patient Information 2013 [place], [place]. Preterm Labor Preterm labor is when labor starts at less than 37 weeks of pregnancy. The normal length of a pregnancy is 39 to 41 weeks. CAUSES Often, there is no identifiable underlying cause as to why a woman goes into preterm labor. However, one of the most common known causes of preterm labor is infection. Infections of the uterus, cervix, vagina, amniotic sac, bladder, kidney, or even the lungs  (pneumonia) can cause labor to start. Other causes of preterm labor include:  Urogenital infections, such as yeast infections and bacterial vaginosis.  Uterine abnormalities (uterine shape, uterine septum, fibroids, bleeding from the placenta).  A cervix that has been operated on and opens prematurely.  Malformations in the baby.  Multiple gestations (twins, triplets, and so on).  Breakage of the amniotic sac. Additional risk factors for preterm labor include:  Previous history of preterm labor.  Premature rupture of membranes (PROM).  A placenta that covers the opening of the cervix (placenta previa).  A placenta that separates from the uterus (placenta abruption).  A cervix that is too weak to hold the baby in the uterus (incompetence cervix).  Having too much fluid in the amniotic sac (polyhydramnios).  Taking illegal drugs or smoking while pregnant.  Not gaining enough weight while pregnant.  Women younger than 16 and older than 23 years old.  Low socioeconomic status.  African-American ethnicity. SYMPTOMS Signs and symptoms of preterm labor include:  Menstrual-like cramps.  Contractions that are 30 to 70 seconds apart, become very regular, closer together, and are more intense and painful.  Contractions that start on the top of the uterus and spread down to the lower abdomen and back.  A sense of increased pelvic pressure or back pain.  A watery or bloody discharge that comes from the vagina. DIAGNOSIS  A diagnosis can be confirmed by:  A vaginal exam.  An ultrasound of the cervix.  Sampling (swabbing) cervico-vaginal secretions. These samples can be tested for the presence of fetal fibronectin. This is a protein found in cervical discharge which is associated with preterm labor.  Fetal monitoring. TREATMENT  Depending on the length of the pregnancy and other circumstances, a caregiver may suggest bed rest. If necessary, there are medicines that can be  given to stop contractions and to quicken fetal lung maturity. If labor happens before 34 weeks of pregnancy, a prolonged hospital stay may be recommended. Treatment depends on the condition of both the mother and baby. PREVENTION There are some things a mother can do to lower the risk of preterm labor in future pregnancies. A woman can:   Stop smoking.  Maintain healthy weight gain and avoid chemicals and drugs that are  not necessary.  Be watchful for any type of infection.  Inform her caregiver if she has a known history of preterm labor. Document Released: 11/25/2003 Document Revised: 11/27/2011 Document Reviewed: 12/30/2010 [facility] Patient Information 2013 [place], [place].

## 2012-06-20 NOTE — Progress Notes (Signed)
P = 81 Occasional cramping Patient desires flu shot

## 2012-06-27 ENCOUNTER — Ambulatory Visit (INDEPENDENT_AMBULATORY_CARE_PROVIDER_SITE_OTHER): Payer: Self-pay | Admitting: General Practice

## 2012-06-27 ENCOUNTER — Ambulatory Visit: Payer: Self-pay

## 2012-06-27 VITALS — BP 115/78 | HR 93 | Temp 97.8°F | Ht 60.0 in | Wt 170.3 lb

## 2012-06-27 DIAGNOSIS — O09219 Supervision of pregnancy with history of pre-term labor, unspecified trimester: Secondary | ICD-10-CM

## 2012-06-27 MED ORDER — HYDROXYPROGESTERONE CAPROATE 250 MG/ML IM OIL
250.0000 mg | TOPICAL_OIL | Freq: Once | INTRAMUSCULAR | Status: AC
  Administered 2012-06-27: 250 mg via INTRAMUSCULAR

## 2012-07-04 ENCOUNTER — Ambulatory Visit (INDEPENDENT_AMBULATORY_CARE_PROVIDER_SITE_OTHER): Payer: Medicaid Other | Admitting: General Practice

## 2012-07-04 VITALS — BP 115/71 | HR 75 | Temp 98.9°F | Ht 60.0 in | Wt 171.3 lb

## 2012-07-04 DIAGNOSIS — O09219 Supervision of pregnancy with history of pre-term labor, unspecified trimester: Secondary | ICD-10-CM

## 2012-07-04 MED ORDER — HYDROXYPROGESTERONE CAPROATE 250 MG/ML IM OIL
250.0000 mg | TOPICAL_OIL | Freq: Once | INTRAMUSCULAR | Status: AC
  Administered 2012-07-04: 250 mg via INTRAMUSCULAR

## 2012-07-05 ENCOUNTER — Encounter: Payer: Self-pay | Admitting: *Deleted

## 2012-07-05 ENCOUNTER — Telehealth: Payer: Self-pay | Admitting: *Deleted

## 2012-07-05 NOTE — Telephone Encounter (Addendum)
Pt left message requesting letter for new employer which states she can work.  I returned call to pt and asked her to call back w/details of the type of work she will be performing. Her letter will stat that she may not lift more than 20 lbs. The letter can be faxed if she will provide the fax number or sh may pick it up on Monday 10/21.  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 I advised that she should not pick up the children as they will weigh more than 20 lbs. Pt voiced understanding. She will pick up the letter on 07/08/12.

## 2012-07-11 ENCOUNTER — Ambulatory Visit (INDEPENDENT_AMBULATORY_CARE_PROVIDER_SITE_OTHER): Payer: Medicaid Other | Admitting: Family Medicine

## 2012-07-11 VITALS — BP 122/78 | Temp 98.5°F | Wt 171.3 lb

## 2012-07-11 DIAGNOSIS — O09219 Supervision of pregnancy with history of pre-term labor, unspecified trimester: Secondary | ICD-10-CM

## 2012-07-11 LAB — POCT URINALYSIS DIP (DEVICE)
Bilirubin Urine: NEGATIVE
Glucose, UA: NEGATIVE mg/dL
Ketones, ur: NEGATIVE mg/dL
Nitrite: NEGATIVE

## 2012-07-11 MED ORDER — HYDROXYPROGESTERONE CAPROATE 250 MG/ML IM OIL
250.0000 mg | TOPICAL_OIL | Freq: Once | INTRAMUSCULAR | Status: AC
  Administered 2012-07-11: 250 mg via INTRAMUSCULAR

## 2012-07-11 NOTE — Progress Notes (Signed)
No complaints. 17-P today.

## 2012-07-11 NOTE — Patient Instructions (Signed)
Pregnancy - Second Trimester The second trimester of pregnancy (3 to 6 months) is a period of rapid growth for you and your baby. At the end of the sixth month, your baby is about 9 inches long and weighs 1 1/2 pounds. You will begin to feel the baby move between 18 and 20 weeks of the pregnancy. This is called quickening. Weight gain is faster. A clear fluid (colostrum) may leak out of your breasts. You may feel small contractions of the womb (uterus). This is known as false labor or Braxton-Hicks contractions. This is like a practice for labor when the baby is ready to be born. Usually, the problems with morning sickness have usually passed by the end of your first trimester. Some women develop small dark blotches (called cholasma, mask of pregnancy) on their face that usually goes away after the baby is born. Exposure to the sun makes the blotches worse. Acne may also develop in some pregnant women and pregnant women who have acne, may find that it goes away. PRENATAL EXAMS  Blood work may continue to be done during prenatal exams. These tests are done to check on your health and the probable health of your baby. Blood work is used to follow your blood levels (hemoglobin). Anemia (low hemoglobin) is common during pregnancy. Iron and vitamins are given to help prevent this. You will also be checked for diabetes between 24 and 28 weeks of the pregnancy. Some of the previous blood tests may be repeated.  The size of the uterus is measured during each visit. This is to make sure that the baby is continuing to grow properly according to the dates of the pregnancy.  Your blood pressure is checked every prenatal visit. This is to make sure you are not getting toxemia.  Your urine is checked to make sure you do not have an infection, diabetes or protein in the urine.  Your weight is checked often to make sure gains are happening at the suggested rate. This is to ensure that both you and your baby are growing  normally.  Sometimes, an ultrasound is performed to confirm the proper growth and development of the baby. This is a test which bounces harmless sound waves off the baby so your caregiver can more accurately determine due dates. Sometimes, a specialized test is done on the amniotic fluid surrounding the baby. This test is called an amniocentesis. The amniotic fluid is obtained by sticking a needle into the belly (abdomen). This is done to check the chromosomes in instances where there is a concern about possible genetic problems with the baby. It is also sometimes done near the end of pregnancy if an early delivery is required. In this case, it is done to help make sure the baby's lungs are mature enough for the baby to live outside of the womb. CHANGES OCCURING IN THE SECOND TRIMESTER OF PREGNANCY Your body goes through many changes during pregnancy. They vary from person to person. Talk to your caregiver about changes you notice that you are concerned about.  During the second trimester, you will likely have an increase in your appetite. It is normal to have cravings for certain foods. This varies from person to person and pregnancy to pregnancy.  Your lower abdomen will begin to bulge.  You may have to urinate more often because the uterus and baby are pressing on your bladder. It is also common to get more bladder infections during pregnancy (pain with urination). You can help this by   drinking lots of fluids and emptying your bladder before and after intercourse.  You may begin to get stretch marks on your hips, abdomen, and breasts. These are normal changes in the body during pregnancy. There are no exercises or medications to take that prevent this change.  You may begin to develop swollen and bulging veins (varicose veins) in your legs. Wearing support hose, elevating your feet for 15 minutes, 3 to 4 times a day and limiting salt in your diet helps lessen the problem.  Heartburn may develop  as the uterus grows and pushes up against the stomach. Antacids recommended by your caregiver helps with this problem. Also, eating smaller meals 4 to 5 times a day helps.  Constipation can be treated with a stool softener or adding bulk to your diet. Drinking lots of fluids, vegetables, fruits, and whole grains are helpful.  Exercising is also helpful. If you have been very active up until your pregnancy, most of these activities can be continued during your pregnancy. If you have been less active, it is helpful to start an exercise program such as walking.  Hemorrhoids (varicose veins in the rectum) may develop at the end of the second trimester. Warm sitz baths and hemorrhoid cream recommended by your caregiver helps hemorrhoid problems.  Backaches may develop during this time of your pregnancy. Avoid heavy lifting, wear low heal shoes and practice good posture to help with backache problems.  Some pregnant women develop tingling and numbness of their hand and fingers because of swelling and tightening of ligaments in the wrist (carpel tunnel syndrome). This goes away after the baby is born.  As your breasts enlarge, you may have to get a bigger bra. Get a comfortable, cotton, support bra. Do not get a nursing bra until the last month of the pregnancy if you will be nursing the baby.  You may get a dark line from your belly button to the pubic area called the linea nigra.  You may develop rosy cheeks because of increase blood flow to the face.  You may develop spider looking lines of the face, neck, arms and chest. These go away after the baby is born. HOME CARE INSTRUCTIONS   It is extremely important to avoid all smoking, herbs, alcohol, and unprescribed drugs during your pregnancy. These chemicals affect the formation and growth of the baby. Avoid these chemicals throughout the pregnancy to ensure the delivery of a healthy infant.  Most of your home care instructions are the same as  suggested for the first trimester of your pregnancy. Keep your caregiver's appointments. Follow your caregiver's instructions regarding medication use, exercise and diet.  During pregnancy, you are providing food for you and your baby. Continue to eat regular, well-balanced meals. Choose foods such as meat, fish, milk and other low fat dairy products, vegetables, fruits, and whole-grain breads and cereals. Your caregiver will tell you of the ideal weight gain.  A physical sexual relationship may be continued up until near the end of pregnancy if there are no other problems. Problems could include early (premature) leaking of amniotic fluid from the membranes, vaginal bleeding, abdominal pain, or other medical or pregnancy problems.  Exercise regularly if there are no restrictions. Check with your caregiver if you are unsure of the safety of some of your exercises. The greatest weight gain will occur in the last 2 trimesters of pregnancy. Exercise will help you:  Control your weight.  Get you in shape for labor and delivery.  Lose weight   after you have the baby.  Wear a good support or jogging bra for breast tenderness during pregnancy. This may help if worn during sleep. Pads or tissues may be used in the bra if you are leaking colostrum.  Do not use hot tubs, steam rooms or saunas throughout the pregnancy.  Wear your seat belt at all times when driving. This protects you and your baby if you are in an accident.  Avoid raw meat, uncooked cheese, cat litter boxes and soil used by cats. These carry germs that can cause birth defects in the baby.  The second trimester is also a good time to visit your dentist for your dental health if this has not been done yet. Getting your teeth cleaned is OK. Use a soft toothbrush. Brush gently during pregnancy.  It is easier to loose urine during pregnancy. Tightening up and strengthening the pelvic muscles will help with this problem. Practice stopping your  urination while you are going to the bathroom. These are the same muscles you need to strengthen. It is also the muscles you would use as if you were trying to stop from passing gas. You can practice tightening these muscles up 10 times a set and repeating this about 3 times per day. Once you know what muscles to tighten up, do not perform these exercises during urination. It is more likely to contribute to an infection by backing up the urine.  Ask for help if you have financial, counseling or nutritional needs during pregnancy. Your caregiver will be able to offer counseling for these needs as well as refer you for other special needs.  Your skin may become oily. If so, wash your face with mild soap, use non-greasy moisturizer and oil or cream based makeup. MEDICATIONS AND DRUG USE IN PREGNANCY  Take prenatal vitamins as directed. The vitamin should contain 1 milligram of folic acid. Keep all vitamins out of reach of children. Only a couple vitamins or tablets containing iron may be fatal to a baby or young child when ingested.  Avoid use of all medications, including herbs, over-the-counter medications, not prescribed or suggested by your caregiver. Only take over-the-counter or prescription medicines for pain, discomfort, or fever as directed by your caregiver. Do not use aspirin.  Let your caregiver also know about herbs you may be using.  Alcohol is related to a number of birth defects. This includes fetal alcohol syndrome. All alcohol, in any form, should be avoided completely. Smoking will cause low birth rate and premature babies.  Street or illegal drugs are very harmful to the baby. They are absolutely forbidden. A baby born to an addicted mother will be addicted at birth. The baby will go through the same withdrawal an adult does. SEEK MEDICAL CARE IF:  You have any concerns or worries during your pregnancy. It is better to call with your questions if you feel they cannot wait, rather  than worry about them. SEEK IMMEDIATE MEDICAL CARE IF:   An unexplained oral temperature above 102 F (38.9 C) develops, or as your caregiver suggests.  You have leaking of fluid from the vagina (birth canal). If leaking membranes are suspected, take your temperature and tell your caregiver of this when you call.  There is vaginal spotting, bleeding, or passing clots. Tell your caregiver of the amount and how many pads are used. Light spotting in pregnancy is common, especially following intercourse.  You develop a bad smelling vaginal discharge with a change in the color from clear   to white.  You continue to feel sick to your stomach (nauseated) and have no relief from remedies suggested. You vomit blood or coffee ground-like materials.  You lose more than 2 pounds of weight or gain more than 2 pounds of weight over 1 week, or as suggested by your caregiver.  You notice swelling of your face, hands, feet, or legs.  You get exposed to German measles and have never had them.  You are exposed to fifth disease or chickenpox.  You develop belly (abdominal) pain. Round ligament discomfort is a common non-cancerous (benign) cause of abdominal pain in pregnancy. Your caregiver still must evaluate you.  You develop a bad headache that does not go away.  You develop fever, diarrhea, pain with urination, or shortness of breath.  You develop visual problems, blurry, or double vision.  You fall or are in a car accident or any kind of trauma.  There is mental or physical violence at home. Document Released: 08/29/2001 Document Revised: 11/27/2011 Document Reviewed: 03/03/2009 [employer] Patient Information 2013 [employer].  

## 2012-07-11 NOTE — Progress Notes (Signed)
Pulse: 90

## 2012-07-18 ENCOUNTER — Ambulatory Visit (INDEPENDENT_AMBULATORY_CARE_PROVIDER_SITE_OTHER): Payer: Medicaid Other | Admitting: *Deleted

## 2012-07-18 VITALS — BP 121/71 | HR 97 | Wt 174.3 lb

## 2012-07-18 DIAGNOSIS — O09219 Supervision of pregnancy with history of pre-term labor, unspecified trimester: Secondary | ICD-10-CM

## 2012-07-18 MED ORDER — MEDROXYPROGESTERONE ACETATE 150 MG/ML IM SUSP: 150.0000 mg | INTRAMUSCULAR | Status: DC

## 2012-07-18 MED ORDER — HYDROXYPROGESTERONE CAPROATE 250 MG/ML IM OIL
250.0000 mg | TOPICAL_OIL | Freq: Once | INTRAMUSCULAR | Status: AC
  Administered 2012-07-18: 250 mg via INTRAMUSCULAR

## 2012-07-18 NOTE — Progress Notes (Signed)
17-P given  

## 2012-07-25 ENCOUNTER — Ambulatory Visit (INDEPENDENT_AMBULATORY_CARE_PROVIDER_SITE_OTHER): Payer: Medicaid Other

## 2012-07-25 VITALS — BP 109/71 | HR 78 | Wt 174.5 lb

## 2012-07-25 DIAGNOSIS — O09219 Supervision of pregnancy with history of pre-term labor, unspecified trimester: Secondary | ICD-10-CM

## 2012-07-25 MED ORDER — HYDROXYPROGESTERONE CAPROATE 250 MG/ML IM OIL
250.0000 mg | TOPICAL_OIL | Freq: Once | INTRAMUSCULAR | Status: AC
  Administered 2012-07-25: 250 mg via INTRAMUSCULAR

## 2012-08-01 ENCOUNTER — Ambulatory Visit (INDEPENDENT_AMBULATORY_CARE_PROVIDER_SITE_OTHER): Payer: Medicaid Other | Admitting: *Deleted

## 2012-08-01 VITALS — BP 113/68 | HR 83 | Temp 98.0°F | Wt 174.9 lb

## 2012-08-01 DIAGNOSIS — O09899 Supervision of other high risk pregnancies, unspecified trimester: Secondary | ICD-10-CM

## 2012-08-01 DIAGNOSIS — O09219 Supervision of pregnancy with history of pre-term labor, unspecified trimester: Secondary | ICD-10-CM

## 2012-08-01 MED ORDER — HYDROXYPROGESTERONE CAPROATE 250 MG/ML IM OIL
250.0000 mg | TOPICAL_OIL | Freq: Once | INTRAMUSCULAR | Status: AC
  Administered 2012-08-01: 250 mg via INTRAMUSCULAR

## 2012-08-08 ENCOUNTER — Encounter: Payer: Self-pay | Admitting: Obstetrics and Gynecology

## 2012-08-08 ENCOUNTER — Ambulatory Visit (INDEPENDENT_AMBULATORY_CARE_PROVIDER_SITE_OTHER): Payer: Medicaid Other | Admitting: Obstetrics and Gynecology

## 2012-08-08 VITALS — BP 128/73 | Temp 97.0°F | Wt 177.4 lb

## 2012-08-08 DIAGNOSIS — O09219 Supervision of pregnancy with history of pre-term labor, unspecified trimester: Secondary | ICD-10-CM

## 2012-08-08 DIAGNOSIS — R87619 Unspecified abnormal cytological findings in specimens from cervix uteri: Secondary | ICD-10-CM

## 2012-08-08 DIAGNOSIS — O0990 Supervision of high risk pregnancy, unspecified, unspecified trimester: Secondary | ICD-10-CM

## 2012-08-08 DIAGNOSIS — Z23 Encounter for immunization: Secondary | ICD-10-CM

## 2012-08-08 DIAGNOSIS — IMO0002 Reserved for concepts with insufficient information to code with codable children: Secondary | ICD-10-CM

## 2012-08-08 DIAGNOSIS — R6889 Other general symptoms and signs: Secondary | ICD-10-CM

## 2012-08-08 DIAGNOSIS — O09899 Supervision of other high risk pregnancies, unspecified trimester: Secondary | ICD-10-CM

## 2012-08-08 LAB — POCT URINALYSIS DIP (DEVICE)
Bilirubin Urine: NEGATIVE
Glucose, UA: NEGATIVE mg/dL
Nitrite: NEGATIVE
pH: 8.5 — ABNORMAL HIGH (ref 5.0–8.0)

## 2012-08-08 LAB — CBC
Platelets: 320 10*3/uL (ref 150–400)
RBC: 4.4 MIL/uL (ref 3.87–5.11)
WBC: 14.8 10*3/uL — ABNORMAL HIGH (ref 4.0–10.5)

## 2012-08-08 MED ORDER — ALBUTEROL SULFATE HFA 108 (90 BASE) MCG/ACT IN AERS: 2.0000 | INHALATION_SPRAY | Freq: Four times a day (QID) | RESPIRATORY_TRACT | Status: DC | PRN

## 2012-08-08 MED ORDER — HYDROXYPROGESTERONE CAPROATE 250 MG/ML IM OIL
250.0000 mg | TOPICAL_OIL | Freq: Once | INTRAMUSCULAR | Status: AC
  Administered 2012-08-08: 250 mg via INTRAMUSCULAR

## 2012-08-08 MED ORDER — TETANUS-DIPHTH-ACELL PERTUSSIS 5-2.5-18.5 LF-MCG/0.5 IM SUSP
0.5000 mL | Freq: Once | INTRAMUSCULAR | Status: AC
  Administered 2012-08-08: 0.5 mL via INTRAMUSCULAR

## 2012-08-08 NOTE — Progress Notes (Signed)
Pulse: 91 Pt needs refill on her inhaler.  1hr gtt due at 0852 Also needs cbc and rpr.

## 2012-08-08 NOTE — Progress Notes (Signed)
Patient doing well without complaints. Continue weekly 17-P. Size greater than dates will order growth ultrasound. 1hr GCT, labs and TDap today. FM/PTL precautions reviewed

## 2012-08-08 NOTE — Progress Notes (Signed)
U/S scheduled 08/23/12 at 830 am.

## 2012-08-09 ENCOUNTER — Encounter (HOSPITAL_COMMUNITY): Payer: Self-pay

## 2012-08-09 ENCOUNTER — Inpatient Hospital Stay (HOSPITAL_COMMUNITY)
Admission: AD | Admit: 2012-08-09 | Discharge: 2012-08-09 | Disposition: A | Discharge status: Home / Self Care | Payer: Medicaid Other | Source: Ambulatory Visit | Attending: Obstetrics & Gynecology | Admitting: Obstetrics & Gynecology

## 2012-08-09 DIAGNOSIS — E860 Dehydration: Secondary | ICD-10-CM | POA: Insufficient documentation

## 2012-08-09 DIAGNOSIS — O99891 Other specified diseases and conditions complicating pregnancy: Secondary | ICD-10-CM | POA: Insufficient documentation

## 2012-08-09 DIAGNOSIS — O26899 Other specified pregnancy related conditions, unspecified trimester: Secondary | ICD-10-CM

## 2012-08-09 DIAGNOSIS — R51 Headache: Secondary | ICD-10-CM

## 2012-08-09 DIAGNOSIS — R197 Diarrhea, unspecified: Secondary | ICD-10-CM | POA: Insufficient documentation

## 2012-08-09 DIAGNOSIS — O9989 Other specified diseases and conditions complicating pregnancy, childbirth and the puerperium: Secondary | ICD-10-CM

## 2012-08-09 DIAGNOSIS — O212 Late vomiting of pregnancy: Secondary | ICD-10-CM | POA: Insufficient documentation

## 2012-08-09 LAB — COMPREHENSIVE METABOLIC PANEL
BUN: 7 mg/dL (ref 6–23)
CO2: 23 mEq/L (ref 19–32)
Calcium: 8.4 mg/dL (ref 8.4–10.5)
Creatinine, Ser: 0.51 mg/dL (ref 0.50–1.10)
GFR calc Af Amer: >90 mL/min (ref >90)
GFR calc non Af Amer: >90 mL/min (ref >90)
Glucose, Bld: 84 mg/dL (ref 70–99)
Total Protein: 6.3 g/dL (ref 6.0–8.3)

## 2012-08-09 LAB — CBC
Hemoglobin: 9.7 g/dL — ABNORMAL LOW (ref 12.0–15.0)
MCH: 22.9 pg — ABNORMAL LOW (ref 26.0–34.0)
MCHC: 32 g/dL (ref 30.0–36.0)
MCV: 71.6 fL — ABNORMAL LOW (ref 78.0–100.0)
RBC: 4.23 MIL/uL (ref 3.87–5.11)

## 2012-08-09 LAB — URINALYSIS, ROUTINE W REFLEX MICROSCOPIC
Nitrite: NEGATIVE
Protein, ur: NEGATIVE mg/dL
Specific Gravity, Urine: 1.025 (ref 1.005–1.030)
Urobilinogen, UA: 4 mg/dL — ABNORMAL HIGH (ref 0.0–1.0)

## 2012-08-09 LAB — RPR

## 2012-08-09 MED ORDER — LACTATED RINGERS IV BOLUS (SEPSIS)
1000.0000 mL | Freq: Once | INTRAVENOUS | Status: AC
  Administered 2012-08-09: 1000 mL via INTRAVENOUS

## 2012-08-09 MED ORDER — BUTALBITAL-APAP-CAFFEINE 50-325-40 MG PO TABS
1.0000 | ORAL_TABLET | Freq: Once | ORAL | Status: AC
  Administered 2012-08-09: 1 via ORAL
  Filled 2012-08-09: qty 1

## 2012-08-09 NOTE — MAU Provider Note (Signed)
History     CSN: [id number]  Arrival date and time: 08/09/12 2004   First Provider Initiated Contact with Patient 08/09/12 2159      No chief complaint on file.  HPI  Brandi Shaw is a 21 y.o. [redacted]w[redacted]d presenting with headache following a 2 day course of vomiting and watery diarrhea. The headache is described as unilateral and aching. She took a Tylenol once yesterday with no relief. The vomiting and diarrhea improved today. She is not tolerating solids but is drinking water and gingerale. Her daughter had similar symptoms of vomiting and diarrhea early this week. Currently she denies abdominal pain, but expresses concern that for the past couple weeks she has been having achy pain in the abdomen with certain movements.    Past Medical History  Diagnosis Date  . Trichimoniasis   . Anemia   . Abnormal Pap smear 2006    had colposcopy>nml  . Asthma     Albuterol as needed  . Preterm labor     Past Surgical History  Procedure Date  . Tonsillectomy   . Colposcopy     Family History  Problem Relation Age of Onset  . Diabetes Mother   . Drug abuse Mother     crack  . Other Neg Hx   . Diabetes Maternal Grandmother     History  Substance Use Topics  . Smoking status: Former Smoker    Quit date: 03/15/2008  . Smokeless tobacco: Not on file  . Alcohol Use: No    Allergies:  Allergies  Allergen Reactions  . Banana Hives  . Latex Hives  . Penicillins Hives    Prescriptions prior to admission  Medication Sig Dispense Refill  . acetaminophen (TYLENOL) 500 MG tablet Take 500 mg by mouth every 6 (six) hours as needed.      Marland Kitchen albuterol (PROVENTIL HFA;VENTOLIN HFA) 108 (90 BASE) MCG/ACT inhaler Inhale 2 puffs into the lungs every 6 (six) hours as needed for wheezing.  1 Inhaler  6  . Fe Fum-FePoly-FA-Vit C-Vit B3 (INTEGRA F) 125-1 MG CAPS Take 1 tablet by mouth daily.  30 capsule  1    Review of Systems  Constitutional: Negative for fever and chills.  Eyes:  Negative for blurred vision and photophobia.  Gastrointestinal: Positive for nausea, vomiting, abdominal pain and diarrhea.  Genitourinary: Negative for dysuria.  Neurological: Positive for headaches. Negative for dizziness and focal weakness.   Physical Exam   Blood pressure 106/60, pulse 101, temperature 98.3 F (36.8 C), resp. rate 20, height 5' (1.524 m), weight 78.563 kg (173 lb 3.2 oz), last menstrual period 01/21/2012, unknown if currently breastfeeding.  Physical Exam  Nursing note and vitals reviewed. Constitutional: She is oriented to person, place, and time. She appears well-developed and well-nourished.  Cardiovascular: Normal rate.   Respiratory: Effort normal.  GI: Soft. She exhibits no distension. There is no tenderness.  Genitourinary:        External: normal Vagina: normal Cervix: closed/thick/high  Neurological: She is alert and oriented to person, place, and time.  Skin: Skin is warm and dry.    MAU Course  Procedures  Results for orders placed during the hospital encounter of 08/09/12 (from the past 24 hour(s))  CBC     Status: Abnormal   Collection Time   08/09/12  9:29 PM      Component Value Range   WBC 12.0 (*) 4.0 - 10.5 K/uL   RBC 4.23  3.87 - 5.11 MIL/uL   Hemoglobin 9.7 (*)  12.0 - 15.0 g/dL   HCT 16.1 (*) 09.6 - 04.5 %   MCV 71.6 (*) 78.0 - 100.0 fL   MCH 22.9 (*) 26.0 - 34.0 pg   MCHC 32.0  30.0 - 36.0 g/dL   RDW 40.9  81.1 - 91.4 %   Platelets 261  150 - 400 K/uL  COMPREHENSIVE METABOLIC PANEL     Status: Abnormal   Collection Time   08/09/12  9:29 PM      Component Value Range   Sodium 132 (*) 135 - 145 mEq/L   Potassium 3.1 (*) 3.5 - 5.1 mEq/L   Chloride 100  96 - 112 mEq/L   CO2 23  19 - 32 mEq/L   Glucose, Bld 84  70 - 99 mg/dL   BUN 7  6 - 23 mg/dL   Creatinine, Ser 7.82  0.50 - 1.10 mg/dL   Calcium 8.4  8.4 - 95.6 mg/dL   Total Protein 6.3  6.0 - 8.3 g/dL   Albumin 2.3 (*) 3.5 - 5.2 g/dL   AST 12  0 - 37 U/L   ALT 9  0 - 35  U/L   Alkaline Phosphatase 69  39 - 117 U/L   Total Bilirubin 0.3  0.3 - 1.2 mg/dL   GFR calc non Af Amer >90  >90 mL/min   GFR calc Af Amer >90  >90 mL/min  URINALYSIS, ROUTINE W REFLEX MICROSCOPIC     Status: Abnormal   Collection Time   08/09/12  9:52 PM      Component Value Range   Color, Urine YELLOW  YELLOW   APPearance CLEAR  CLEAR   Specific Gravity, Urine 1.025  1.005 - 1.030   pH 6.0  5.0 - 8.0   Glucose, UA NEGATIVE  NEGATIVE mg/dL   Hgb urine dipstick NEGATIVE  NEGATIVE   Bilirubin Urine SMALL (*) NEGATIVE   Ketones, ur >80 (*) NEGATIVE mg/dL   Protein, ur NEGATIVE  NEGATIVE mg/dL   Urobilinogen, UA 4.0 (*) 0.0 - 1.0 mg/dL   Nitrite NEGATIVE  NEGATIVE   Leukocytes, UA NEGATIVE  NEGATIVE    2309: Pt reports adequate relief of headache from Fioricet.   Assessment and Plan  Headache in pregnancy Likely related to dehydration 2/2 N/V/D  Increase PO fluids at home OTC analgesic as needed.   Corky Downs 08/09/2012, 10:35 PM   I have seen the patient with the resident/student and agree with the above.

## 2012-08-09 NOTE — MAU Note (Signed)
Yesterday had h/a L side of head about lunch time. Then threw up everything I ate and had watery diarrhea. No vomiting today but did have diarrhea but not as watery as yesterday. Took a nap today and when awoke had bad pain L side of head which continues and is sore to touch.

## 2012-08-12 ENCOUNTER — Encounter: Payer: Self-pay | Admitting: Obstetrics and Gynecology

## 2012-08-12 NOTE — MAU Provider Note (Signed)
Attestation of Attending Supervision of Advanced Practitioner (CNM/NP): Evaluation and management procedures were performed by the Advanced Practitioner under my supervision and collaboration.  I have reviewed the Advanced Practitioner's note and chart, and I agree with the management and plan.  Zylan Almquist, MD, FACOG Attending Obstetrician & Gynecologist Faculty Practice, [facility]  

## 2012-08-13 ENCOUNTER — Ambulatory Visit (INDEPENDENT_AMBULATORY_CARE_PROVIDER_SITE_OTHER): Payer: Medicaid Other | Admitting: Medical

## 2012-08-13 VITALS — BP 125/74 | HR 112

## 2012-08-13 DIAGNOSIS — O09219 Supervision of pregnancy with history of pre-term labor, unspecified trimester: Secondary | ICD-10-CM

## 2012-08-13 MED ORDER — HYDROXYPROGESTERONE CAPROATE 250 MG/ML IM OIL
250.0000 mg | TOPICAL_OIL | Freq: Once | INTRAMUSCULAR | Status: AC
  Administered 2012-08-13: 250 mg via INTRAMUSCULAR

## 2012-08-22 ENCOUNTER — Encounter: Payer: Self-pay | Admitting: Obstetrics & Gynecology

## 2012-08-22 ENCOUNTER — Ambulatory Visit (INDEPENDENT_AMBULATORY_CARE_PROVIDER_SITE_OTHER): Payer: Medicaid Other | Admitting: *Deleted

## 2012-08-22 VITALS — BP 112/71 | HR 90 | Temp 97.3°F | Wt 171.7 lb

## 2012-08-22 DIAGNOSIS — O09219 Supervision of pregnancy with history of pre-term labor, unspecified trimester: Secondary | ICD-10-CM

## 2012-08-22 MED ORDER — HYDROXYPROGESTERONE CAPROATE 250 MG/ML IM OIL
250.0000 mg | TOPICAL_OIL | Freq: Once | INTRAMUSCULAR | Status: AC
  Administered 2012-08-22: 250 mg via INTRAMUSCULAR

## 2012-08-23 ENCOUNTER — Ambulatory Visit (HOSPITAL_COMMUNITY)
Admission: RE | Admit: 2012-08-23 | Discharge: 2012-08-23 | Disposition: A | Discharge status: Home / Self Care | Payer: Medicaid Other | Source: Ambulatory Visit | Attending: Obstetrics and Gynecology | Admitting: Obstetrics and Gynecology

## 2012-08-23 DIAGNOSIS — O3660X0 Maternal care for excessive fetal growth, unspecified trimester, not applicable or unspecified: Secondary | ICD-10-CM | POA: Insufficient documentation

## 2012-08-23 DIAGNOSIS — O0990 Supervision of high risk pregnancy, unspecified, unspecified trimester: Secondary | ICD-10-CM

## 2012-08-23 DIAGNOSIS — Z3689 Encounter for other specified antenatal screening: Secondary | ICD-10-CM | POA: Insufficient documentation

## 2012-08-28 ENCOUNTER — Encounter: Payer: Medicaid Other | Admitting: Obstetrics & Gynecology

## 2012-08-29 ENCOUNTER — Ambulatory Visit (INDEPENDENT_AMBULATORY_CARE_PROVIDER_SITE_OTHER): Payer: Medicaid Other | Admitting: Obstetrics & Gynecology

## 2012-08-29 ENCOUNTER — Encounter: Payer: Self-pay | Admitting: Obstetrics & Gynecology

## 2012-08-29 VITALS — BP 126/78 | Temp 97.7°F | Wt 175.4 lb

## 2012-08-29 DIAGNOSIS — O0990 Supervision of high risk pregnancy, unspecified, unspecified trimester: Secondary | ICD-10-CM

## 2012-08-29 DIAGNOSIS — O09219 Supervision of pregnancy with history of pre-term labor, unspecified trimester: Secondary | ICD-10-CM

## 2012-08-29 LAB — POCT URINALYSIS DIP (DEVICE)
Bilirubin Urine: NEGATIVE
Glucose, UA: NEGATIVE mg/dL
Ketones, ur: NEGATIVE mg/dL
Protein, ur: NEGATIVE mg/dL

## 2012-08-29 MED ORDER — HYDROXYPROGESTERONE CAPROATE 250 MG/ML IM OIL
250.0000 mg | TOPICAL_OIL | Freq: Once | INTRAMUSCULAR | Status: AC
  Administered 2012-08-29: 250 mg via INTRAMUSCULAR

## 2012-08-29 NOTE — Progress Notes (Signed)
12/6 ultrasound at [redacted]w[redacted]d showed EFW 1668g/59%, AFI 13.85 cm, cephalic, cervix 3.4 cm.  Patient interested in BTS, papers signed today.  Will continue weekly 17-P. No other complaints or concerns.  Fetal movement and labor precautions reviewed.

## 2012-08-29 NOTE — Addendum Note (Signed)
Addended by: Faythe Casa on: 08/29/2012 09:34 AM   Modules accepted: Orders

## 2012-08-29 NOTE — Progress Notes (Signed)
Consent signed for BTS.

## 2012-08-29 NOTE — Patient Instructions (Addendum)
Return to clinic for any obstetric concerns or go to MAU for evaluation  

## 2012-08-30 DIAGNOSIS — O0990 Supervision of high risk pregnancy, unspecified, unspecified trimester: Secondary | ICD-10-CM

## 2012-09-05 ENCOUNTER — Ambulatory Visit (INDEPENDENT_AMBULATORY_CARE_PROVIDER_SITE_OTHER): Payer: Medicaid Other | Admitting: *Deleted

## 2012-09-05 VITALS — BP 140/82 | HR 101 | Temp 97.7°F | Wt 176.8 lb

## 2012-09-05 DIAGNOSIS — O09219 Supervision of pregnancy with history of pre-term labor, unspecified trimester: Secondary | ICD-10-CM

## 2012-09-05 MED ORDER — HYDROXYPROGESTERONE CAPROATE 250 MG/ML IM OIL
250.0000 mg | TOPICAL_OIL | Freq: Once | INTRAMUSCULAR | Status: AC
  Administered 2012-09-05: 250 mg via INTRAMUSCULAR

## 2012-09-05 MED ORDER — INTEGRA F 125-1 MG PO CAPS: 1.0000 | ORAL_CAPSULE | Freq: Every day | ORAL | Status: DC

## 2012-09-12 ENCOUNTER — Ambulatory Visit (INDEPENDENT_AMBULATORY_CARE_PROVIDER_SITE_OTHER): Payer: Medicaid Other | Admitting: Obstetrics & Gynecology

## 2012-09-12 VITALS — BP 136/81 | Temp 96.6°F | Wt 177.7 lb

## 2012-09-12 DIAGNOSIS — O0990 Supervision of high risk pregnancy, unspecified, unspecified trimester: Secondary | ICD-10-CM

## 2012-09-12 DIAGNOSIS — O09219 Supervision of pregnancy with history of pre-term labor, unspecified trimester: Secondary | ICD-10-CM

## 2012-09-12 LAB — POCT URINALYSIS DIP (DEVICE)
Bilirubin Urine: NEGATIVE
Glucose, UA: NEGATIVE mg/dL
Specific Gravity, Urine: 1.02 (ref 1.005–1.030)
Urobilinogen, UA: 1 mg/dL (ref 0.0–1.0)

## 2012-09-12 MED ORDER — HYDROXYPROGESTERONE CAPROATE 250 MG/ML IM OIL
250.0000 mg | TOPICAL_OIL | Freq: Once | INTRAMUSCULAR | Status: AC
  Administered 2012-09-12: 250 mg via INTRAMUSCULAR

## 2012-09-12 NOTE — Patient Instructions (Signed)
Normal Labor and Delivery  Your caregiver must first be sure you are in labor. Signs of labor include:  · You may pass what is called "the mucus plug" before labor begins. This is a small amount of blood stained mucus.  · Regular uterine contractions.  · The time between contractions get closer together.  · The discomfort and pain gradually gets more intense.  · Pains are mostly located in the back.  · Pains get worse when walking.  · The cervix (the opening of the uterus becomes thinner (begins to efface) and opens up (dilates).  Once you are in labor and admitted into the hospital or care center, your caregiver will do the following:  · A complete physical examination.  · Check your vital signs (blood pressure, pulse, temperature and the fetal heart rate).  · Do a vaginal examination (using a sterile glove and lubricant) to determine:  · The position (presentation) of the baby (head [vertex] or buttock first).  · The level (station) of the baby's head in the birth canal.  · The effacement and dilatation of the cervix.  · You may have your pubic hair shaved and be given an enema depending on your caregiver and the circumstance.  · An electronic monitor is usually placed on your abdomen. The monitor follows the length and intensity of the contractions, as well as the baby's heart rate.  · Usually, your caregiver will insert an IV in your arm with a bottle of sugar water. This is done as a precaution so that medications can be given to you quickly during labor or delivery.  NORMAL LABOR AND DELIVERY IS DIVIDED UP INTO 3 STAGES:  First Stage  This is when regular contractions begin and the cervix begins to efface and dilate. This stage can last from 3 to 15 hours. The end of the first stage is when the cervix is 100% effaced and 10 centimeters dilated. Pain medications may be given by   · Injection (morphine, demerol, etc.)  · Regional anesthesia (spinal, caudal or epidural, anesthetics given in different locations of  the spine). Paracervical pain medication may be given, which is an injection of and anesthetic on each side of the cervix.  A pregnant woman may request to have "Natural Childbirth" which is not to have any medications or anesthesia during her labor and delivery.  Second Stage  This is when the baby comes down through the birth canal (vagina) and is born. This can take 1 to 4 hours. As the baby's head comes down through the birth canal, you may feel like you are going to have a bowel movement. You will get the urge to bear down and push until the baby is delivered. As the baby's head is being delivered, the caregiver will decide if an episiotomy (a cut in the perineum and vagina area) is needed to prevent tearing of the tissue in this area. The episiotomy is sewn up after the delivery of the baby and placenta. Sometimes a mask with nitrous oxide is given for the mother to breath during the delivery of the baby to help if there is too much pain. The end of Stage 2 is when the baby is fully delivered. Then when the umbilical cord stops pulsating it is clamped and cut.  Third Stage  The third stage begins after the baby is completely delivered and ends after the placenta (afterbirth) is delivered. This usually takes 5 to 30 minutes. After the placenta is delivered, a medication   is given either by intravenous or injection to help contract the uterus and prevent bleeding. The third stage is not painful and pain medication is usually not necessary. If an episiotomy was done, it is repaired at this time.  After the delivery, the mother is watched and monitored closely for 1 to 2 hours to make sure there is no postpartum bleeding (hemorrhage). If there is a lot of bleeding, medication is given to contract the uterus and stop the bleeding.  Document Released: 06/13/2008 Document Revised: 11/27/2011 Document Reviewed: 06/13/2008  [employer]® Patient Information ©2013 [employer].

## 2012-09-12 NOTE — Progress Notes (Signed)
No contractions or leaking.

## 2012-09-12 NOTE — Progress Notes (Signed)
P = 92 

## 2012-09-18 NOTE — L&D Delivery Note (Signed)
Delivery Note At 6:35 AM a viable and healthy female was delivered via Vaginal, Spontaneous Delivery (Presentation: ; Occiput Anterior). Loose nuchal cord reduced without difficulty.   APGAR: , 8; weight 7 lb 10.8 oz (3480 g).   Placenta status: Intact, Spontaneous.  Cord: 3 vessels with the following complications: None.  Cord pH: pending.  Anesthesia: Epidural  Episiotomy: None Lacerations: None Suture Repair: n/a Est. Blood Loss (mL): 350  Mom to postpartum.  Baby to nursery-stable.  [facility] 10/20/2012, 7:46 AM

## 2012-09-19 ENCOUNTER — Ambulatory Visit (INDEPENDENT_AMBULATORY_CARE_PROVIDER_SITE_OTHER): Payer: Medicaid Other | Admitting: Family

## 2012-09-19 VITALS — BP 122/80 | Temp 98.6°F | Wt 179.0 lb

## 2012-09-19 DIAGNOSIS — O09219 Supervision of pregnancy with history of pre-term labor, unspecified trimester: Secondary | ICD-10-CM

## 2012-09-19 LAB — POCT URINALYSIS DIP (DEVICE)
Bilirubin Urine: NEGATIVE
Glucose, UA: NEGATIVE mg/dL
Hgb urine dipstick: NEGATIVE
Ketones, ur: NEGATIVE mg/dL
Nitrite: NEGATIVE

## 2012-09-19 MED ORDER — HYDROXYPROGESTERONE CAPROATE 250 MG/ML IM OIL
250.0000 mg | TOPICAL_OIL | Freq: Once | INTRAMUSCULAR | Status: AC
  Administered 2012-09-19: 250 mg via INTRAMUSCULAR

## 2012-09-19 NOTE — Progress Notes (Signed)
Reports 5-6 pains at sides of upper abdomen a day; no report of bleeding or leaking.  PTL precautions reviewed.  17-p today.

## 2012-09-19 NOTE — Progress Notes (Signed)
Pulse- 92  Pain-"stomach area"

## 2012-09-26 ENCOUNTER — Ambulatory Visit (INDEPENDENT_AMBULATORY_CARE_PROVIDER_SITE_OTHER): Payer: Medicaid Other | Admitting: General Practice

## 2012-09-26 VITALS — BP 140/77 | HR 85 | Temp 97.2°F | Ht 60.0 in | Wt 181.8 lb

## 2012-09-26 DIAGNOSIS — O09219 Supervision of pregnancy with history of pre-term labor, unspecified trimester: Secondary | ICD-10-CM

## 2012-09-26 MED ORDER — HYDROXYPROGESTERONE CAPROATE 250 MG/ML IM OIL
250.0000 mg | TOPICAL_OIL | Freq: Once | INTRAMUSCULAR | Status: AC
  Administered 2012-09-26: 250 mg via INTRAMUSCULAR

## 2012-10-03 ENCOUNTER — Ambulatory Visit (INDEPENDENT_AMBULATORY_CARE_PROVIDER_SITE_OTHER): Payer: Medicaid Other | Admitting: Family

## 2012-10-03 ENCOUNTER — Other Ambulatory Visit (HOSPITAL_COMMUNITY)
Admission: RE | Admit: 2012-10-03 | Discharge: 2012-10-03 | Disposition: A | Discharge status: Home / Self Care | Payer: Medicaid Other | Source: Ambulatory Visit | Attending: Family | Admitting: Family

## 2012-10-03 VITALS — BP 131/81 | Temp 97.0°F | Wt 188.4 lb

## 2012-10-03 DIAGNOSIS — Z113 Encounter for screening for infections with a predominantly sexual mode of transmission: Secondary | ICD-10-CM | POA: Insufficient documentation

## 2012-10-03 DIAGNOSIS — O09219 Supervision of pregnancy with history of pre-term labor, unspecified trimester: Secondary | ICD-10-CM

## 2012-10-03 LAB — POCT URINALYSIS DIP (DEVICE)
Bilirubin Urine: NEGATIVE
Ketones, ur: NEGATIVE mg/dL
pH: 7 (ref 5.0–8.0)

## 2012-10-03 MED ORDER — HYDROXYPROGESTERONE CAPROATE 250 MG/ML IM OIL
250.0000 mg | TOPICAL_OIL | Freq: Once | INTRAMUSCULAR | Status: AC
  Administered 2012-10-03: 250 mg via INTRAMUSCULAR

## 2012-10-03 NOTE — Progress Notes (Signed)
Pulse: 102

## 2012-10-03 NOTE — Progress Notes (Signed)
Reports feeling increased pressure; no questions or concerns; GBS, GC/CT today.  Preterm labor precautions.  No UTI symptoms, send urine culture.

## 2012-10-06 LAB — CULTURE, OB URINE

## 2012-10-10 ENCOUNTER — Ambulatory Visit (INDEPENDENT_AMBULATORY_CARE_PROVIDER_SITE_OTHER): Payer: Medicaid Other | Admitting: Family Medicine

## 2012-10-10 ENCOUNTER — Encounter: Payer: Self-pay | Admitting: Family Medicine

## 2012-10-10 VITALS — Temp 96.8°F | Wt 189.6 lb

## 2012-10-10 DIAGNOSIS — O09899 Supervision of other high risk pregnancies, unspecified trimester: Secondary | ICD-10-CM

## 2012-10-10 DIAGNOSIS — N390 Urinary tract infection, site not specified: Secondary | ICD-10-CM

## 2012-10-10 DIAGNOSIS — O0990 Supervision of high risk pregnancy, unspecified, unspecified trimester: Secondary | ICD-10-CM

## 2012-10-10 DIAGNOSIS — O09219 Supervision of pregnancy with history of pre-term labor, unspecified trimester: Secondary | ICD-10-CM

## 2012-10-10 LAB — POCT URINALYSIS DIP (DEVICE)
Bilirubin Urine: NEGATIVE
Glucose, UA: NEGATIVE mg/dL
Nitrite: NEGATIVE
Protein, ur: 30 mg/dL — AB
Specific Gravity, Urine: 1.025 (ref 1.005–1.030)
Urobilinogen, UA: 0.2 mg/dL (ref 0.0–1.0)
pH: 6.5 (ref 5.0–8.0)

## 2012-10-10 MED ORDER — CEFIXIME 400 MG PO TABS: 400.0000 mg | ORAL_TABLET | Freq: Every day | ORAL | Status: DC

## 2012-10-10 NOTE — Assessment & Plan Note (Signed)
Finished 17 P

## 2012-10-10 NOTE — Progress Notes (Signed)
Patient states she has sharp pains in her pelvic and vaginal area. p=94

## 2012-10-10 NOTE — Patient Instructions (Addendum)
Normal Labor and Delivery Your caregiver must first be sure you are in labor. Signs of labor include:  You may pass what is called "the mucus plug" before labor begins. This is a small amount of blood stained mucus.  Regular uterine contractions.  The time between contractions get closer together.  The discomfort and pain gradually gets more intense.  Pains are mostly located in the back.  Pains get worse when walking.  The cervix (the opening of the uterus becomes thinner (begins to efface) and opens up (dilates). Once you are in labor and admitted into the hospital or care center, your caregiver will do the following:  A complete physical examination.  Check your vital signs (blood pressure, pulse, temperature and the fetal heart rate).  Do a vaginal examination (using a sterile glove and lubricant) to determine:  The position (presentation) of the baby (head [vertex] or buttock first).  The level (station) of the baby's head in the birth canal.  The effacement and dilatation of the cervix.  You may have your pubic hair shaved and be given an enema depending on your caregiver and the circumstance.  An electronic monitor is usually placed on your abdomen. The monitor follows the length and intensity of the contractions, as well as the baby's heart rate.  Usually, your caregiver will insert an IV in your arm with a bottle of sugar water. This is done as a precaution so that medications can be given to you quickly during labor or delivery. NORMAL LABOR AND DELIVERY IS DIVIDED UP INTO 3 STAGES: First Stage This is when regular contractions begin and the cervix begins to efface and dilate. This stage can last from 3 to 15 hours. The end of the first stage is when the cervix is 100% effaced and 10 centimeters dilated. Pain medications may be given by   Injection (morphine, demerol, etc.)  Regional anesthesia (spinal, caudal or epidural, anesthetics given in different locations of  the spine). Paracervical pain medication may be given, which is an injection of and anesthetic on each side of the cervix. A pregnant woman may request to have "Natural Childbirth" which is not to have any medications or anesthesia during her labor and delivery. Second Stage This is when the baby comes down through the birth canal (vagina) and is born. This can take 1 to 4 hours. As the baby's head comes down through the birth canal, you may feel like you are going to have a bowel movement. You will get the urge to bear down and push until the baby is delivered. As the baby's head is being delivered, the caregiver will decide if an episiotomy (a cut in the perineum and vagina area) is needed to prevent tearing of the tissue in this area. The episiotomy is sewn up after the delivery of the baby and placenta. Sometimes a mask with nitrous oxide is given for the mother to breath during the delivery of the baby to help if there is too much pain. The end of Stage 2 is when the baby is fully delivered. Then when the umbilical cord stops pulsating it is clamped and cut. Third Stage The third stage begins after the baby is completely delivered and ends after the placenta (afterbirth) is delivered. This usually takes 5 to 30 minutes. After the placenta is delivered, a medication is given either by intravenous or injection to help contract the uterus and prevent bleeding. The third stage is not painful and pain medication is usually not necessary.  If an episiotomy was done, it is repaired at this time. After the delivery, the mother is watched and monitored closely for 1 to 2 hours to make sure there is no postpartum bleeding (hemorrhage). If there is a lot of bleeding, medication is given to contract the uterus and stop the bleeding. Document Released: 06/13/2008 Document Revised: 11/27/2011 Document Reviewed: 06/13/2008 [facility] Patient Information 2013 [place], [place].  Breastfeeding Deciding to breastfeed is  one of the best choices you can make for you and your baby. The information that follows gives a brief overview of the benefits of breastfeeding as well as common topics surrounding breastfeeding. BENEFITS OF BREASTFEEDING For the baby  The first milk (colostrum) helps the baby's digestive system function better.   There are antibodies in the mother's milk that help the baby fight off infections.   The baby has a lower incidence of asthma, allergies, and sudden infant death syndrome (SIDS).   The nutrients in breast milk are better for the baby than infant formulas, and breast milk helps the baby's brain grow better.   Babies who breastfeed have less gas, colic, and constipation.  For the mother  Breastfeeding helps develop a very special bond between the mother and her baby.   Breastfeeding is convenient, always available at the correct temperature, and costs nothing.   Breastfeeding burns calories in the mother and helps her lose weight that was gained during pregnancy.   Breastfeeding makes the uterus contract back down to normal size faster and slows bleeding following delivery.   Breastfeeding mothers have a lower risk of developing breast cancer.  BREASTFEEDING FREQUENCY  A healthy, full-term baby may breastfeed as often as every hour or space his or her feedings to every 3 hours.   Watch your baby for signs of hunger. Nurse your baby if he or she shows signs of hunger. How often you nurse will vary from baby to baby.   Nurse as often as the baby requests, or when you feel the need to reduce the fullness of your breasts.   Awaken the baby if it has been 3 4 hours since the last feeding.   Frequent feeding will help the mother make more milk and will help prevent problems, such as sore nipples and engorgement of the breasts.  BABY'S POSITION AT THE BREAST  Whether lying down or sitting, be sure that the baby's tummy is facing your tummy.   Support the  breast with 4 fingers underneath the breast and the thumb above. Make sure your fingers are well away from the nipple and baby's mouth.   Stroke the baby's lips gently with your finger or nipple.   When the baby's mouth is open wide enough, place all of your nipple and as much of the areola as possible into your baby's mouth.   Pull the baby in close so the tip of the nose and the baby's cheeks touch the breast during the feeding.  FEEDINGS AND SUCTION  The length of each feeding varies from baby to baby and from feeding to feeding.   The baby must suck about 2 3 minutes for your milk to get to him or her. This is called a "let down." For this reason, allow the baby to feed on each breast as long as he or she wants. Your baby will end the feeding when he or she has received the right balance of nutrients.   To break the suction, put your finger into the corner of the baby's  mouth and slide it between his or her gums before removing your breast from his or her mouth. This will help prevent sore nipples.  HOW TO TELL WHETHER YOUR BABY IS GETTING ENOUGH BREAST MILK. Wondering whether or not your baby is getting enough milk is a common concern among mothers. You can be assured that your baby is getting enough milk if:   Your baby is actively sucking and you hear swallowing.   Your baby seems relaxed and satisfied after a feeding.   Your baby nurses at least 8 12 times in a 24 hour time period. Nurse your baby until he or she unlatches or falls asleep at the first breast (at least 10 20 minutes), then offer the second side.   Your baby is wetting 5 6 disposable diapers (6 8 cloth diapers) in a 24 hour period by 33 60 days of age.   Your baby is having at least 3 4 stools every 24 hours for the first 6 weeks. The stool should be soft and yellow.   Your baby should gain 4 7 ounces per week after he or she is 77 days old.   Your breasts feel softer after nursing.  REDUCING BREAST  ENGORGEMENT  In the first week after your baby is born, you may experience signs of breast engorgement. When breasts are engorged, they feel heavy, warm, full, and may be tender to the touch. You can reduce engorgement if you:   Nurse frequently, every 2 3 hours. Mothers who breastfeed early and often have fewer problems with engorgement.   Place light ice packs on your breasts for 10 20 minutes between feedings. This reduces swelling. Wrap the ice packs in a lightweight towel to protect your skin. Bags of frozen vegetables work well for this purpose.   Take a warm shower or apply warm, moist heat to your breast for 5 10 minutes just before each feeding. This increases circulation and helps the milk flow.   Gently massage your breast before and during the feeding. Using your finger tips, massage from the chest wall towards your nipple in a circular motion.   Make sure that the baby empties at least one breast at every feeding before switching sides.   Use a breast pump to empty the breasts if your baby is sleepy or not nursing well. You may also want to pump if you are returning to work oryou feel you are getting engorged.   Avoid bottle feeds, pacifiers, or supplemental feedings of water or juice in place of breastfeeding. Breast milk is all the food your baby needs. It is not necessary for your baby to have water or formula. In fact, to help your breasts make more milk, it is best not to give your baby supplemental feedings during the early weeks.   Be sure the baby is latched on and positioned properly while breastfeeding.   Wear a supportive bra, avoiding underwire styles.   Eat a balanced diet with enough fluids.   Rest often, relax, and take your prenatal vitamins to prevent fatigue, stress, and anemia.  If you follow these suggestions, your engorgement should improve in 24 48 hours. If you are still experiencing difficulty, call your lactation consultant or caregiver.    CARING FOR YOURSELF Take care of your breasts  Bathe or shower daily.   Avoid using soap on your nipples.   Start feedings on your left breast at one feeding and on your right breast at the next feeding.  You will notice an increase in your milk supply 2 5 days after delivery. You may feel some discomfort from engorgement, which makes your breasts very firm and often tender. Engorgement "peaks" out within 24 48 hours. In the meantime, apply warm moist towels to your breasts for 5 10 minutes before feeding. Gentle massage and expression of some milk before feeding will soften your breasts, making it easier for your baby to latch on.   Wear a well-fitting nursing bra, and air dry your nipples for a 3 after each feeding.   Only use cotton bra pads.   Only use pure lanolin on your nipples after nursing. You do not need to wash it off before feeding the baby again. Another option is to express a few drops of breast milk and gently massage it into your nipples.  Take care of yourself  Eat well-balanced meals and nutritious snacks.   Drinking milk, fruit juice, and water to satisfy your thirst (about 8 glasses a day).   Get plenty of rest.  Avoid foods that you notice affect the baby in a bad way.  SEEK MEDICAL CARE IF:   You have difficulty with breastfeeding and need help.   You have a hard, red, sore area on your breast that is accompanied by a fever.   Your baby is too sleepy to eat well or is having trouble sleeping.   Your baby is wetting less than 6 diapers a day, by 31 days of age.   Your baby's skin or white part of his or her eyes is more yellow than it was in the hospital.   You feel depressed.  Document Released: 09/04/2005 Document Revised: 03/05/2012 Document Reviewed: 12/03/2011 [facility] Patient Information 2013 [place], [place].

## 2012-10-10 NOTE — Progress Notes (Signed)
Labor precautions

## 2012-10-11 ENCOUNTER — Other Ambulatory Visit: Payer: Self-pay | Admitting: Family

## 2012-10-14 ENCOUNTER — Encounter (HOSPITAL_COMMUNITY): Payer: Self-pay | Admitting: *Deleted

## 2012-10-14 ENCOUNTER — Inpatient Hospital Stay (HOSPITAL_COMMUNITY)
Admission: AD | Admit: 2012-10-14 | Discharge: 2012-10-14 | Disposition: A | Discharge status: Home / Self Care | Payer: Medicaid Other | Source: Ambulatory Visit | Attending: Obstetrics & Gynecology | Admitting: Obstetrics & Gynecology

## 2012-10-14 DIAGNOSIS — O0990 Supervision of high risk pregnancy, unspecified, unspecified trimester: Secondary | ICD-10-CM

## 2012-10-14 DIAGNOSIS — O99891 Other specified diseases and conditions complicating pregnancy: Secondary | ICD-10-CM | POA: Insufficient documentation

## 2012-10-14 NOTE — MAU Provider Note (Signed)
History    Brandi Shaw is a 22 y.o. [phone] at [redacted]w[redacted]d who presents to the MAU due to possible leakage of fluid since this morning. She explains that she belies she lost her mucous plug on Saturday, and then this morning, she had a "big gush" of fluid, which has been seeming to steadily leak since that time. However, she is not having to wear a pad, just feels that her underwear is damp. She has not noted any bleeding and continues to feel good fetal movement.    CSN: [id number]  Arrival date and time: 10/14/12 1621   First Provider Initiated Contact with Patient 10/14/12 1826      Chief Complaint  Patient presents with  . Labor Eval   HPI  OB History    Grav Para Term Preterm Abortions TAB SAB Ect Mult Living   4 1 0 1 2 1 1 0 0 1       Past Medical History  Diagnosis Date  . Trichimoniasis   . Anemia   . Abnormal Pap smear 2006    had colposcopy>nml  . Asthma     Albuterol as needed  . Preterm labor     Past Surgical History  Procedure Date  . Tonsillectomy   . Colposcopy     Family History  Problem Relation Age of Onset  . Diabetes Mother   . Drug abuse Mother     crack  . Other Neg Hx   . Diabetes Maternal Grandmother     History  Substance Use Topics  . Smoking status: Former Smoker    Quit date: 03/15/2008  . Smokeless tobacco: Not on file  . Alcohol Use: No    Allergies:  Allergies  Allergen Reactions  . Banana Hives  . Latex Hives  . Penicillins Hives    Prescriptions prior to admission  Medication Sig Dispense Refill  . albuterol (PROVENTIL HFA;VENTOLIN HFA) 108 (90 BASE) MCG/ACT inhaler Inhale 2 puffs into the lungs every 6 (six) hours as needed for wheezing.  1 Inhaler  6  . cefixime (SUPRAX) 400 MG tablet Take 1 tablet (400 mg total) by mouth daily.  7 tablet  0  . Fe Fum-FePoly-FA-Vit C-Vit B3 (INTEGRA F) 125-1 MG CAPS Take 1 tablet by mouth daily.  30 capsule  1  . [DISCONTINUED] acetaminophen (TYLENOL) 500 MG tablet Take 500 mg  by mouth every 6 (six) hours as needed.        Review of Systems  Constitutional: Negative for fever and chills.  Eyes: Negative for blurred vision.  Respiratory: Negative for cough and wheezing.   Cardiovascular: Positive for chest pain.  Gastrointestinal: Negative for heartburn, nausea, vomiting, abdominal pain, diarrhea and constipation.  Genitourinary: Negative for dysuria and hematuria.  Neurological: Negative for dizziness, tingling and headaches.   Physical Exam   Blood pressure 129/73, pulse 94, temperature 97.3 F (36.3 C), resp. rate 20, height 5\' 1"  (1.549 m), weight 87.635 kg (193 lb 3.2 oz), last menstrual period 01/21/2012, SpO2 100.00%.  Physical Exam  Constitutional: She is oriented to person, place, and time. She appears well-developed and well-nourished. No distress.  HENT:  Head: Normocephalic.  Eyes: Pupils are equal, round, and reactive to light.  Neck: Normal range of motion. Neck supple.  Cardiovascular: Normal rate, regular rhythm and intact distal pulses.   No murmur heard. Respiratory: Effort normal and breath sounds normal. She has no wheezes.  GI: Soft.       gravid  Genitourinary: Vagina normal.  Musculoskeletal: Normal range of motion. She exhibits no edema.  Neurological: She is alert and oriented to person, place, and time.  Skin: Skin is warm and dry.  Psychiatric: She has a normal mood and affect.   SVE: Dilation: 2.5 Effacement (%): 50 Cervical Position: Posterior Station: -2 Presentation: Vertex Exam by:: Dr. Anna Genre, J. Lowe RN  FHR: 140, + accels, no decels Toco: every 5 minutes  MAU Course  Procedures Results for orders placed during the hospital encounter of 10/14/12 (from the past 24 hour(s))  POCT FERN TEST     Status: Normal   Collection Time   10/14/12  6:37 PM      Component Value Range   POCT Fern Test Negative = intact amniotic membranes       Assessment and Plan  21 y.o. [id number] at [redacted]w[redacted]d presenting for what she  believed to be LOF, however fern test was negative, no pooling on exam, tracing reassuring of fetal status.  Advised on when to return to MAU, continue follow up as previously scheduled.  Seen by me also Agree with note Wynelle Bourgeois CNM  Bonnita Nasuti 10/14/2012, 6:43 PM

## 2012-10-14 NOTE — MAU Provider Note (Signed)
Attestation of Attending Supervision of Advanced Practitioner (CNM/NP): Evaluation and management procedures were performed by the Advanced Practitioner under my supervision and collaboration. I have reviewed the Advanced Practitioner's note and chart, and I agree with the management and plan.  Leeanna Slaby H. 10:54 PM

## 2012-10-14 NOTE — MAU Note (Signed)
Patient states she passed her mucus plug starting on 1-25 and had a big clump this am. Continues to feel damp but not wearing a pad. Has been having pelvic pressure, not sure if contractions. Reports good fetal movement.

## 2012-10-17 ENCOUNTER — Ambulatory Visit (INDEPENDENT_AMBULATORY_CARE_PROVIDER_SITE_OTHER): Payer: Medicaid Other | Admitting: Family

## 2012-10-17 ENCOUNTER — Other Ambulatory Visit (HOSPITAL_COMMUNITY)
Admission: RE | Admit: 2012-10-17 | Discharge: 2012-10-17 | Disposition: A | Discharge status: Home / Self Care | Payer: Medicaid Other | Source: Ambulatory Visit | Attending: Family | Admitting: Family

## 2012-10-17 VITALS — BP 121/82 | Temp 96.8°F | Wt 193.7 lb

## 2012-10-17 DIAGNOSIS — O09219 Supervision of pregnancy with history of pre-term labor, unspecified trimester: Secondary | ICD-10-CM

## 2012-10-17 DIAGNOSIS — O0990 Supervision of high risk pregnancy, unspecified, unspecified trimester: Secondary | ICD-10-CM

## 2012-10-17 DIAGNOSIS — Z113 Encounter for screening for infections with a predominantly sexual mode of transmission: Secondary | ICD-10-CM | POA: Insufficient documentation

## 2012-10-17 LAB — POCT URINALYSIS DIP (DEVICE)
Leukocytes, UA: NEGATIVE
Nitrite: NEGATIVE
Protein, ur: NEGATIVE mg/dL
Urobilinogen, UA: 1 mg/dL (ref 0.0–1.0)
pH: 6.5 (ref 5.0–8.0)

## 2012-10-17 NOTE — Addendum Note (Signed)
Addended by: Soyla Murphy T on: 10/17/2012 01:06 PM   Modules accepted: Orders

## 2012-10-17 NOTE — Progress Notes (Signed)
Reports losing mucus plug Saturday; finished antibiotics for UTI, no symptoms; reviewed GBS results.  Informed regarding circumcision fees. GC/CT recollected today.

## 2012-10-19 ENCOUNTER — Encounter (HOSPITAL_COMMUNITY): Payer: Self-pay

## 2012-10-19 ENCOUNTER — Inpatient Hospital Stay (HOSPITAL_COMMUNITY)
Admission: AD | Admit: 2012-10-19 | Discharge: 2012-10-22 | DRG: 775 | Disposition: A | Discharge status: Home / Self Care | Payer: Medicaid Other | Source: Ambulatory Visit | Attending: Obstetrics & Gynecology | Admitting: Obstetrics & Gynecology

## 2012-10-19 DIAGNOSIS — Z302 Encounter for sterilization: Secondary | ICD-10-CM

## 2012-10-19 DIAGNOSIS — IMO0001 Reserved for inherently not codable concepts without codable children: Secondary | ICD-10-CM

## 2012-10-19 DIAGNOSIS — O9903 Anemia complicating the puerperium: Principal | ICD-10-CM | POA: Diagnosis not present

## 2012-10-19 DIAGNOSIS — D649 Anemia, unspecified: Secondary | ICD-10-CM | POA: Diagnosis not present

## 2012-10-19 DIAGNOSIS — O0990 Supervision of high risk pregnancy, unspecified, unspecified trimester: Secondary | ICD-10-CM

## 2012-10-19 NOTE — MAU Note (Signed)
contractions 3-5 minutes since 6pm. Denies leaking of fluid or vaginal bleeding. Positive fetal movement.

## 2012-10-20 ENCOUNTER — Encounter (HOSPITAL_COMMUNITY): Payer: Self-pay | Admitting: *Deleted

## 2012-10-20 ENCOUNTER — Inpatient Hospital Stay (HOSPITAL_COMMUNITY): Payer: Medicaid Other | Admitting: Anesthesiology

## 2012-10-20 ENCOUNTER — Encounter (HOSPITAL_COMMUNITY): Payer: Self-pay | Admitting: Anesthesiology

## 2012-10-20 DIAGNOSIS — O9903 Anemia complicating the puerperium: Secondary | ICD-10-CM

## 2012-10-20 DIAGNOSIS — D649 Anemia, unspecified: Secondary | ICD-10-CM

## 2012-10-20 LAB — CBC
HCT: 31.2 % — ABNORMAL LOW (ref 36.0–46.0)
Hemoglobin: 9.7 g/dL — ABNORMAL LOW (ref 12.0–15.0)
MCH: 22.2 pg — ABNORMAL LOW (ref 26.0–34.0)
MCHC: 31.1 g/dL (ref 30.0–36.0)
MCV: 71.6 fL — ABNORMAL LOW (ref 78.0–100.0)

## 2012-10-20 LAB — SURGICAL PCR SCREEN: Staphylococcus aureus: NEGATIVE

## 2012-10-20 LAB — PREPARE RBC (CROSSMATCH)

## 2012-10-20 MED ORDER — EPHEDRINE 5 MG/ML INJ
10.0000 mg | INTRAVENOUS | Status: DC | PRN
  Filled 2012-10-20: qty 4

## 2012-10-20 MED ORDER — LACTATED RINGERS IV SOLN
INTRAVENOUS | Status: DC
  Administered 2012-10-21: 07:00:00 via INTRAVENOUS

## 2012-10-20 MED ORDER — BENZOCAINE-MENTHOL 20-0.5 % EX AERO
1.0000 "application " | INHALATION_SPRAY | CUTANEOUS | Status: DC | PRN
  Filled 2012-10-20: qty 56

## 2012-10-20 MED ORDER — LACTATED RINGERS IV SOLN
500.0000 mL | INTRAVENOUS | Status: DC | PRN
  Administered 2012-10-20: 500 mL via INTRAVENOUS

## 2012-10-20 MED ORDER — PHENYLEPHRINE 40 MCG/ML (10ML) SYRINGE FOR IV PUSH (FOR BLOOD PRESSURE SUPPORT)
80.0000 ug | PREFILLED_SYRINGE | INTRAVENOUS | Status: DC | PRN
  Filled 2012-10-20: qty 5

## 2012-10-20 MED ORDER — METOCLOPRAMIDE HCL 10 MG PO TABS
10.0000 mg | ORAL_TABLET | Freq: Once | ORAL | Status: AC
  Administered 2012-10-21: 10 mg via ORAL
  Filled 2012-10-20: qty 1

## 2012-10-20 MED ORDER — LIDOCAINE HCL (PF) 1 % IJ SOLN
INTRAMUSCULAR | Status: DC | PRN
  Administered 2012-10-20 (×2): 5 mL

## 2012-10-20 MED ORDER — ONDANSETRON HCL 4 MG/2ML IJ SOLN: 4.0000 mg | Freq: Four times a day (QID) | INTRAMUSCULAR | Status: DC | PRN

## 2012-10-20 MED ORDER — TETANUS-DIPHTH-ACELL PERTUSSIS 5-2.5-18.5 LF-MCG/0.5 IM SUSP: 0.5000 mL | Freq: Once | INTRAMUSCULAR | Status: DC

## 2012-10-20 MED ORDER — LACTATED RINGERS IV SOLN
INTRAVENOUS | Status: DC
  Administered 2012-10-20 (×2): via INTRAVENOUS

## 2012-10-20 MED ORDER — OXYTOCIN BOLUS FROM INFUSION
500.0000 mL | INTRAVENOUS | Status: DC
  Administered 2012-10-20: 500 mL via INTRAVENOUS

## 2012-10-20 MED ORDER — LANOLIN HYDROUS EX OINT: TOPICAL_OINTMENT | CUTANEOUS | Status: DC | PRN

## 2012-10-20 MED ORDER — IBUPROFEN 600 MG PO TABS
600.0000 mg | ORAL_TABLET | Freq: Four times a day (QID) | ORAL | Status: DC
  Administered 2012-10-20 – 2012-10-22 (×7): 600 mg via ORAL
  Filled 2012-10-20 (×8): qty 1

## 2012-10-20 MED ORDER — LACTATED RINGERS IV SOLN: 500.0000 mL | Freq: Once | INTRAVENOUS | Status: DC

## 2012-10-20 MED ORDER — PNEUMOCOCCAL VAC POLYVALENT 25 MCG/0.5ML IJ INJ
0.5000 mL | INJECTION | INTRAMUSCULAR | Status: AC
  Filled 2012-10-20: qty 0.5

## 2012-10-20 MED ORDER — PRENATAL MULTIVITAMIN CH
1.0000 | ORAL_TABLET | Freq: Every day | ORAL | Status: DC
  Administered 2012-10-20 – 2012-10-22 (×3): 1 via ORAL
  Filled 2012-10-20 (×3): qty 1

## 2012-10-20 MED ORDER — ONDANSETRON HCL 4 MG/2ML IJ SOLN: 4.0000 mg | INTRAMUSCULAR | Status: DC | PRN

## 2012-10-20 MED ORDER — EPHEDRINE 5 MG/ML INJ: 10.0000 mg | INTRAVENOUS | Status: DC | PRN

## 2012-10-20 MED ORDER — FENTANYL 2.5 MCG/ML BUPIVACAINE 1/10 % EPIDURAL INFUSION (WH - ANES)
14.0000 mL/h | INTRAMUSCULAR | Status: DC
  Administered 2012-10-20: 14 mL/h via EPIDURAL
  Filled 2012-10-20: qty 125

## 2012-10-20 MED ORDER — DIBUCAINE 1 % RE OINT: 1.0000 "application " | TOPICAL_OINTMENT | RECTAL | Status: DC | PRN

## 2012-10-20 MED ORDER — OXYCODONE-ACETAMINOPHEN 5-325 MG PO TABS
1.0000 | ORAL_TABLET | ORAL | Status: DC | PRN
  Administered 2012-10-20 – 2012-10-22 (×5): 1 via ORAL
  Filled 2012-10-20 (×5): qty 1

## 2012-10-20 MED ORDER — OXYCODONE-ACETAMINOPHEN 5-325 MG PO TABS: 1.0000 | ORAL_TABLET | ORAL | Status: DC | PRN

## 2012-10-20 MED ORDER — ZOLPIDEM TARTRATE 5 MG PO TABS: 5.0000 mg | ORAL_TABLET | Freq: Every evening | ORAL | Status: DC | PRN

## 2012-10-20 MED ORDER — SIMETHICONE 80 MG PO CHEW: 80.0000 mg | CHEWABLE_TABLET | ORAL | Status: DC | PRN

## 2012-10-20 MED ORDER — OXYTOCIN 40 UNITS IN LACTATED RINGERS INFUSION - SIMPLE MED
62.5000 mL/h | INTRAVENOUS | Status: DC
  Filled 2012-10-20: qty 1000

## 2012-10-20 MED ORDER — ONDANSETRON HCL 4 MG PO TABS: 4.0000 mg | ORAL_TABLET | ORAL | Status: DC | PRN

## 2012-10-20 MED ORDER — CITRIC ACID-SODIUM CITRATE 334-500 MG/5ML PO SOLN: 30.0000 mL | ORAL | Status: DC | PRN

## 2012-10-20 MED ORDER — WITCH HAZEL-GLYCERIN EX PADS: 1.0000 "application " | MEDICATED_PAD | CUTANEOUS | Status: DC | PRN

## 2012-10-20 MED ORDER — FAMOTIDINE 20 MG PO TABS
40.0000 mg | ORAL_TABLET | Freq: Once | ORAL | Status: AC
  Administered 2012-10-21: 40 mg via ORAL
  Filled 2012-10-20: qty 2

## 2012-10-20 MED ORDER — PHENYLEPHRINE 40 MCG/ML (10ML) SYRINGE FOR IV PUSH (FOR BLOOD PRESSURE SUPPORT): 80.0000 ug | PREFILLED_SYRINGE | INTRAVENOUS | Status: DC | PRN

## 2012-10-20 MED ORDER — DIPHENHYDRAMINE HCL 25 MG PO CAPS: 25.0000 mg | ORAL_CAPSULE | Freq: Four times a day (QID) | ORAL | Status: DC | PRN

## 2012-10-20 MED ORDER — DIPHENHYDRAMINE HCL 50 MG/ML IJ SOLN: 12.5000 mg | INTRAMUSCULAR | Status: DC | PRN

## 2012-10-20 MED ORDER — IBUPROFEN 600 MG PO TABS: 600.0000 mg | ORAL_TABLET | Freq: Four times a day (QID) | ORAL | Status: DC | PRN

## 2012-10-20 MED ORDER — ALBUTEROL SULFATE HFA 108 (90 BASE) MCG/ACT IN AERS
2.0000 | INHALATION_SPRAY | Freq: Four times a day (QID) | RESPIRATORY_TRACT | Status: DC | PRN
  Filled 2012-10-20: qty 6.7

## 2012-10-20 MED ORDER — ACETAMINOPHEN 325 MG PO TABS: 650.0000 mg | ORAL_TABLET | ORAL | Status: DC | PRN

## 2012-10-20 MED ORDER — SENNOSIDES-DOCUSATE SODIUM 8.6-50 MG PO TABS: 2.0000 | ORAL_TABLET | Freq: Every day | ORAL | Status: DC

## 2012-10-20 MED ORDER — LIDOCAINE HCL (PF) 1 % IJ SOLN
30.0000 mL | INTRAMUSCULAR | Status: DC | PRN
  Filled 2012-10-20: qty 30

## 2012-10-20 NOTE — H&P (Signed)
Brandi Shaw is a 22 y.o. female presenting for contractions.  Pt in High Risk clinic for history of preterm delivery.  Reports clear fluid since admission to [facility]. Maternal Medical History:  Reason for admission: Reason for admission: contractions.  Contractions: Onset was 3-5 hours ago.   Frequency: regular.   Perceived severity is strong.    Fetal activity: Perceived fetal activity is normal.   Last perceived fetal movement was within the past hour.      OB History    Grav Para Term Preterm Abortions TAB SAB Ect Mult Living   4 1 0 1 2 1 1 0 0 1      Past Medical History  Diagnosis Date  . Trichimoniasis   . Anemia   . Abnormal Pap smear 2006    had colposcopy>nml  . Asthma     Albuterol as needed  . Preterm labor    Past Surgical History  Procedure Date  . Tonsillectomy   . Colposcopy    Family History: family history includes Diabetes in her maternal grandmother and mother and Drug abuse in her mother.  There is no history of Other. Social History:  reports that she quit smoking about 4 years ago. She does not have any smokeless tobacco history on file. She reports that she does not drink alcohol or use illicit drugs.   Prenatal Transfer Tool  Maternal Diabetes: No Genetic Screening: Declined Maternal Ultrasounds/Referrals: Abnormal:  Findings:   Isolated EIF (echogenic intracardiac focus) Fetal Ultrasounds or other Referrals:  None Maternal Substance Abuse:  No Significant Maternal Medications:  None Significant Maternal Lab Results:  Lab values include: Group B Strep negative Other Comments:  None  Review of Systems  Gastrointestinal: Positive for abdominal pain (contractions).    Dilation: 3.5 Effacement (%): 60 Station: -3 Exam by:: Lucy Chris RNC Blood pressure 129/86, pulse 100, temperature 98 F (36.7 C), temperature source Oral, resp. rate 18, height 5' (1.524 m), weight 87.272 kg (192 lb 6.4 oz), last menstrual period 01/21/2012. Maternal  Exam:  Abdomen: Estimated fetal weight is 8-8.5.   Fetal presentation: vertex  Introitus: Vagina is positive for vaginal discharge (thin clear fluid seen on perineum; +ferns).    Fetal Exam Fetal Monitor Review: Baseline rate: 150's.  Variability: minimal (<5 bpm).   Pattern: no accelerations.    Fetal State Assessment: Category II - tracings are indeterminate.     Physical Exam  Constitutional: She is oriented to person, place, and time. She appears well-developed and well-nourished.       Appears uncomfortable  HENT:  Head: Normocephalic.  Neck: Normal range of motion. Neck supple.  Cardiovascular: Normal rate, regular rhythm and normal heart sounds.   Respiratory: Effort normal and breath sounds normal.  GI: Soft. There is no tenderness.  Genitourinary: No bleeding around the vagina. Vaginal discharge (thin clear fluid seen on perineum; +ferns) found.  Neurological: She is alert and oriented to person, place, and time.  Skin: Skin is warm and dry.    Prenatal labs: ABO, Rh: O/POS/-- (08/15 1015) Antibody: NEG (08/15 1015) Rubella:   RPR: NON REAC (11/21 0906)  HBsAg: NEGATIVE (08/15 1015)  HIV: NON REACTIVE (08/15 1015)  GBS: Negative (02/01 0000)   Assessment/Plan: Rupture of Membranes GBS Negative  Plan: Admit to Labor Epidural upon request Anticipate NSVD.   [facility] 10/20/2012, 12:39 AM

## 2012-10-20 NOTE — Anesthesia Postprocedure Evaluation (Signed)
Anesthesia Post Note  Patient: Brandi Shaw  Procedure(s) Performed: * No procedures listed *  Anesthesia type: Epidural  Patient location: Mother/Baby  Post pain: Pain level controlled  Post assessment: Post-op Vital signs reviewed  Last Vitals:  Filed Vitals:   10/20/12 1311  BP: 115/75  Pulse: 96  Temp:   Resp: 19    Post vital signs: Reviewed  Level of consciousness:alert  Complications: No apparent anesthesia complications

## 2012-10-20 NOTE — Progress Notes (Signed)
[name] CNM updated of SVE and pt urge to push.  CNM will report to pt bedside now.

## 2012-10-20 NOTE — Progress Notes (Signed)
   Subjective: Pt reports increased rectal pressure.  Consents to AROM of forebag.  Objective: BP 130/79  Pulse 78  Temp 98 F (36.7 C) (Oral)  Resp 18  Ht 5' (1.524 m)  Wt 87.272 kg (192 lb 6.4 oz)  BMI 37.58 kg/m2  SpO2 96%  LMP 01/21/2012      FHT:  FHR: 140's bpm, variability: minimal ,  accelerations:  Abscent,  decelerations:  Absent; + moderate variability and accels with fetal scalp stimulation. UC:   Poor tracing; toco readjusted SVE:   Dilation: 6 Effacement (%): 80 Station: -3 Exam by:: L Lamon   Labs: Lab Results  Component Value Date   WBC 13.5* 10/20/2012   HGB 9.7* 10/20/2012   HCT 31.2* 10/20/2012   MCV 71.6* 10/20/2012   PLT 287 10/20/2012    Assessment / Plan: Spontaneous labor, progressing normally  Labor: Progressing normally Preeclampsia:  N/a Fetal Wellbeing:  Category II Pain Control:  Epidural I/D:  n/a Anticipated MOD:  NSVD AROM - clearMUHAMMAD,Brandi Shaw 10/20/2012, 3:25 AM

## 2012-10-20 NOTE — Anesthesia Procedure Notes (Signed)

## 2012-10-20 NOTE — Anesthesia Preprocedure Evaluation (Signed)
Anesthesia Evaluation  Patient identified by MRN, date of birth, ID band Patient awake    Reviewed: Allergy & Precautions, H&P , Patient's Chart, lab work & pertinent test results  Airway Mallampati: III TM Distance: >3 FB Neck ROM: full    Dental No notable dental hx.    Pulmonary neg pulmonary ROS, asthma ,  breath sounds clear to auscultation  Pulmonary exam normal       Cardiovascular negative cardio ROS  Rhythm:regular Rate:Normal     Neuro/Psych negative neurological ROS  negative psych ROS   GI/Hepatic negative GI ROS, Neg liver ROS,   Endo/Other  negative endocrine ROS  Renal/GU negative Renal ROS     Musculoskeletal   Abdominal   Peds  Hematology negative hematology ROS (+) anemia ,   Anesthesia Other Findings Trichimoniasis     Anemia        Abnormal Pap smear 2006 had colposcopy>nml Asthma   Albuterol as needed    Preterm labor    Reproductive/Obstetrics (+) Pregnancy                           Anesthesia Physical Anesthesia Plan  ASA: III  Anesthesia Plan: Epidural   Post-op Pain Management:    Induction:   Airway Management Planned:   Additional Equipment:   Intra-op Plan:   Post-operative Plan:   Informed Consent: I have reviewed the patients History and Physical, chart, labs and discussed the procedure including the risks, benefits and alternatives for the proposed anesthesia with the patient or authorized representative who has indicated his/her understanding and acceptance.     Plan Discussed with:   Anesthesia Plan Comments:         Anesthesia Quick Evaluation

## 2012-10-20 NOTE — Progress Notes (Signed)
[name] CNM updated on FHR, UC pattern, SVE, and pt feeling pressure.  CNM to report to pt bedside to break forebag.

## 2012-10-21 ENCOUNTER — Encounter (HOSPITAL_COMMUNITY): Payer: Self-pay

## 2012-10-21 ENCOUNTER — Encounter (HOSPITAL_COMMUNITY)
Admission: AD | Disposition: A | Discharge status: Home / Self Care | Payer: Self-pay | Source: Ambulatory Visit | Attending: Obstetrics & Gynecology

## 2012-10-21 ENCOUNTER — Inpatient Hospital Stay (HOSPITAL_COMMUNITY): Payer: Medicaid Other

## 2012-10-21 DIAGNOSIS — Z302 Encounter for sterilization: Secondary | ICD-10-CM

## 2012-10-21 SURGERY — CANCELLED PROCEDURE
Anesthesia: Spinal | Laterality: Bilateral

## 2012-10-21 MED ORDER — BUPIVACAINE HCL (PF) 0.5 % IJ SOLN
INTRAMUSCULAR | Status: AC
  Filled 2012-10-21: qty 30

## 2012-10-21 SURGICAL SUPPLY — 23 items
BENZOIN TINCTURE PRP APPL 2/3 (GAUZE/BANDAGES/DRESSINGS) IMPLANT
CHLORAPREP W/TINT 26ML (MISCELLANEOUS) ×2 IMPLANT
CLOTH BEACON ORANGE TIMEOUT ST (SAFETY) IMPLANT
ELECT REM PT RETURN 9FT ADLT (ELECTROSURGICAL)
ELECTRODE REM PT RTRN 9FT ADLT (ELECTROSURGICAL) IMPLANT
GLOVE BIO SURGEON STRL SZ7 (GLOVE) IMPLANT
GLOVE BIOGEL PI IND STRL 7.0 (GLOVE) IMPLANT
GLOVE BIOGEL PI INDICATOR 7.0 (GLOVE)
GOWN PREVENTION PLUS LG XLONG (DISPOSABLE) IMPLANT
NEEDLE HYPO 25X1 1.5 SAFETY (NEEDLE) IMPLANT
NS IRRIG 1000ML POUR BTL (IV SOLUTION) IMPLANT
PACK ABDOMINAL MINOR (CUSTOM PROCEDURE TRAY) IMPLANT
PENCIL BUTTON HOLSTER BLD 10FT (ELECTRODE) IMPLANT
SPONGE LAP 4X18 X RAY DECT (DISPOSABLE) IMPLANT
STRIP CLOSURE SKIN 1/2X4 (GAUZE/BANDAGES/DRESSINGS) IMPLANT
SUT CHROMIC 2 0 TIES 18 (SUTURE) IMPLANT
SUT VIC AB 0 CT1 27 (SUTURE)
SUT VIC AB 0 CT1 27XBRD ANBCTR (SUTURE) IMPLANT
SUT VICRYL 4-0 PS2 18IN ABS (SUTURE) IMPLANT
SYR CONTROL 10ML LL (SYRINGE) IMPLANT
TOWEL OR 17X24 6PK STRL BLUE (TOWEL DISPOSABLE) IMPLANT
TRAY FOLEY CATH 14FR (SET/KITS/TRAYS/PACK) IMPLANT
WATER STERILE IRR 1000ML POUR (IV SOLUTION) IMPLANT

## 2012-10-21 NOTE — Progress Notes (Signed)
Post Partum Day 1 Subjective: no complaints, up ad lib, voiding, tolerating PO and + flatus  Objective: Blood pressure 104/67, pulse 63, temperature 97.4 F (36.3 C), temperature source Oral, resp. rate 17, height 5' (1.524 m), weight 192 lb 6.4 oz (87.272 kg), last menstrual period 01/21/2012, SpO2 95.00%, unknown if currently breastfeeding.  Physical Exam:  General: alert and no distress Lochia: appropriate Uterine Fundus: firm Incision:  DVT Evaluation: No evidence of DVT seen on physical exam. Negative Homan's sign. No cords or calf tenderness.   Basename 10/20/12 0055  HGB 9.7*  HCT 31.2*    Assessment/Plan: Plan for discharge tomorrow Has BTL scheduled for 0930 today Questions asked/answered   LOS: 2 days   [facility] 10/21/2012, 7:44 AM

## 2012-10-21 NOTE — Anesthesia Preprocedure Evaluation (Addendum)
Anesthesia Evaluation  Patient identified by MRN, date of birth, ID band Patient awake    Reviewed: Allergy & Precautions, H&P , Patient's Chart, lab work & pertinent test results  Airway Mallampati: III TM Distance: >3 FB Neck ROM: full    Dental No notable dental hx.    Pulmonary neg pulmonary ROS, asthma ,  breath sounds clear to auscultation  Pulmonary exam normal       Cardiovascular negative cardio ROS  Rhythm:regular Rate:Normal     Neuro/Psych negative neurological ROS  negative psych ROS   GI/Hepatic negative GI ROS, Neg liver ROS,   Endo/Other  negative endocrine ROS  Renal/GU negative Renal ROS     Musculoskeletal   Abdominal   Peds  Hematology negative hematology ROS (+)   Anesthesia Other Findings     Anemia         Asthma   Albuterol as needed    Preterm labor    Reproductive/Obstetrics (+) Pregnancy                           Anesthesia Physical  Anesthesia Plan  ASA: III  Anesthesia Plan: Spinal   Post-op Pain Management:    Induction:   Airway Management Planned:   Additional Equipment:   Intra-op Plan:   Post-operative Plan:   Informed Consent: I have reviewed the patients History and Physical, chart, labs and discussed the procedure including the risks, benefits and alternatives for the proposed anesthesia with the patient or authorized representative who has indicated his/her understanding and acceptance.   Dental Advisory Given  Plan Discussed with: CRNA  Anesthesia Plan Comments: (Epidural removed; lab work confirmed with CRNA in room. Platelets okay. Discussed spinal anesthetic, and patient consents to the procedure:  included risk of possible headache,backache, failed block, allergic reaction, and nerve injury. This patient was asked if she had any questions or concerns before the procedure started. )       Anesthesia Quick Evaluation

## 2012-10-21 NOTE — Progress Notes (Signed)
Attending Preoperative Progress Note  22 y.o. [id number] PPD#1 s/p SVD with scheduled PPBTL this morning, currently in the preoperative area.  Other reversible forms of contraception were discussed with patient; also emphasized risk of regret given her age.  She declines all other modalities. Risks of procedure discussed with patient including but not limited to: risk of regret, permanence of method, bleeding, infection, injury to surrounding organs and need for additional procedures.  Failure risk of 1-2% with increased risk of ectopic gestation if pregnancy occurs was also discussed with patient.  Patient verbalized understanding of these risks and wants to proceed with sterilization.    Unfortunately, patient's epidural was discontinued after her SVD, and she is not willing to undergo having spinal analgesia administered.  She opts for interval PPBTL.  Laparoscopic BTS was scheduled on 11/25/12 at 11 am.   Routine preoperative instructions of having nothing to eat or drink after midnight on the day prior to surgery and also coming to the hospital 1 1/2 hours prior to her time of surgery were also emphasized.  She was told she may be called for a preoperative appointment about a week prior to surgery and will be given further preoperative instructions at that visit. Printed patient education handouts about the procedure will be given to the patient to review at home.  Patient will be transferred back to her postpartum room, regular diet ordered.  Will continue routine postpartum care.  Jaynie Collins, MD, FACOG Attending Obstetrician & Gynecologist Faculty Practice, [facility]

## 2012-10-21 NOTE — Progress Notes (Signed)
UR chart review completed.  

## 2012-10-22 LAB — TYPE AND SCREEN: Unit division: 0

## 2012-10-22 MED ORDER — DOCUSATE SODIUM 100 MG PO CAPS: 100.0000 mg | ORAL_CAPSULE | Freq: Every day | ORAL | Status: DC | PRN

## 2012-10-22 MED ORDER — IBUPROFEN 600 MG PO TABS: 600.0000 mg | ORAL_TABLET | Freq: Four times a day (QID) | ORAL | Status: DC

## 2012-10-22 NOTE — Discharge Summary (Signed)
Obstetric Discharge Summary  Brandi Shaw is a 22 y.o. [id number] presenting at [redacted]w[redacted]d in active labor. She progressed normally to complete dilation and spontaneous vaginal delivery with no complications. Her postpartum course was uneventful. She is breastfeeding and plans interval BTL (appt scheduled for 11/25/12).  Reason for Admission: onset of labor Prenatal Procedures: none Intrapartum Procedures: spontaneous vaginal delivery Postpartum Procedures: none Complications-Operative and Postpartum: none Hemoglobin  Date Value Range Status  10/20/2012 9.7* 12.0 - 15.0 g/dL Final     HCT  Date Value Range Status  10/20/2012 31.2* 36.0 - 46.0 % Final    Physical Exam:  General: alert, cooperative and no distress Lochia: appropriate Uterine Fundus: firm Incision: N/A DVT Evaluation: No evidence of DVT seen on physical exam. Negative Homan's sign. No cords or calf tenderness. No significant calf/ankle edema.  Discharge Diagnoses: Term Pregnancy-delivered and postpartum anemia  Discharge Information: Date: 10/22/2012 Activity: pelvic rest Diet: routine Medications: PNV, Ibuprofen and Colace Condition: stable Instructions: refer to practice specific booklet Discharge to: home Follow-up Information    Follow up with [facility], MD. On 11/25/2012. (Surgery appointment for tubal sterilization scheduled at 11am. Please come to the hospital at 9:30am. Nothing to eat or drink after midnight before surgery.)    Contact information:   [address] [place] [address] [phone]          Newborn Data: Live born female  Birth Weight: 7 lb 10.8 oz (3480 g) APGAR: 4, 8  Home with mother.  Napoleon Form 10/22/2012, 7:18 AM

## 2012-10-22 NOTE — Discharge Summary (Signed)
Attestation of Attending Supervision of Obstetric Fellow: Evaluation and management procedures were performed by the Obstetric Fellow under my supervision and collaboration.  I have reviewed the Obstetric Fellow's note and chart, and I agree with the management and plan.  Rodneshia Greenhouse, MD, FACOG Attending Obstetrician & Gynecologist Faculty Practice, [facility]   

## 2012-10-24 ENCOUNTER — Encounter: Payer: Medicaid Other | Admitting: Family Medicine

## 2012-11-11 ENCOUNTER — Telehealth: Payer: Self-pay

## 2012-11-11 NOTE — Telephone Encounter (Signed)
Pt called and stated that she needed an earlier appt for her tubial cause she doesn't think that her job will allow her to have time off.

## 2012-11-13 NOTE — Telephone Encounter (Signed)
Called pt and discussed her concern. She states that she will return to work on 12/02/12 and is worried that she will not be able to have much time off from work for post op healing. I informed pt that her surgery (tubal sterilization) is scheduled on 12/12/12 which is a Thursday. She should be able to return to work on Monday 12/16/12.  Pt agreed and voiced understanding.

## 2012-11-26 ENCOUNTER — Encounter (HOSPITAL_COMMUNITY): Payer: Self-pay | Admitting: Pharmacist

## 2012-11-27 ENCOUNTER — Ambulatory Visit (INDEPENDENT_AMBULATORY_CARE_PROVIDER_SITE_OTHER): Payer: Medicaid Other | Admitting: Obstetrics & Gynecology

## 2012-11-27 ENCOUNTER — Encounter: Payer: Self-pay | Admitting: *Deleted

## 2012-11-27 ENCOUNTER — Encounter: Payer: Self-pay | Admitting: Obstetrics & Gynecology

## 2012-11-27 NOTE — Progress Notes (Signed)
Patient ID: Brandi Shaw, female   DOB: 1991-08-29, 22 y.o.   MRN: [id number] Subjective:     Brandi Shaw is a 22 y.o. female who presents for a postpartum visit. She is 6 weeks postpartum following a spontaneous vaginal delivery. I have fully reviewed the prenatal and intrapartum course. The delivery was at 39 gestational weeks. Outcome: spontaneous vaginal delivery. Postpartum course has been uncomplicated. Baby's course has been benign. Baby is feeding by both breast and bottle - unk. Bleeding no bleeding. Bowel function is normal. Bladder function is normal. Patient is sexually active. Contraception method is condoms. Postpartum depression screening: negative.  The following portions of the patient's history were reviewed and updated as appropriate: allergies, current medications, past family history, past medical history, past social history, past surgical history and problem list.  Review of Systems A comprehensive review of systems was negative.   Objective:    BP 132/85  Pulse 60  Temp(Src) 97.8 F (36.6 C) (Oral)  Resp 20  Ht 5\' 5"  (1.651 m)  Wt 165 lb 14.4 oz (75.252 kg)  BMI 27.61 kg/m2  LMP 11/19/2012  Breastfeeding? Yes  General:  alert and no distress           Abdomen: soft, non-tender; bowel sounds normal; no masses,  no organomegaly   Vulva:  normal  Vagina: normal vagina  Cervix:  no cervical motion tenderness  Corpus: normal size, contour, position, consistency, mobility, non-tender  Adnexa:  normal adnexa           Assessment:    6 week postpartum exam. Pap smear not done at today's visit.   Plan:    1. Contraception: condoms 2. Pt planning for interval sterilization 12/12/2012 The risks of surgery were discussed in detail with the patient including but not limited to: bleeding which may require transfusion or reoperation; infection which may require prolonged hospitalization or re-hospitalization and antibiotic therapy; injury to bowel,  bladder, ureters and major vessels or other surrounding organs; need for additional procedures including laparotomy; thromboembolic phenomenon, incisional problems and other postoperative or anesthesia complications.  Patient was told that the likelihood that her condition and symptoms will be treated effectively with this surgical management was very high; the postoperative expectations were also discussed in detail. The patient also understands the alternative treatment options which were discussed in full. All questions were answered.  3. Follow up in: 2 week post partum or as needed.

## 2012-11-27 NOTE — Progress Notes (Signed)
Patient ID: Brandi Shaw, female   DOB: 1990-11-11, 22 y.o.   MRN: [id number] Pt encouraged to call Lactation Consultant regarding low milk production.

## 2012-11-27 NOTE — Patient Instructions (Signed)
Laparoscopic Tubal Ligation Laparoscopic tubal ligation is a procedure that closes the fallopian tubes at a time other than right after childbirth. By closing the fallopian tubes, the eggs that are released from the ovaries cannot enter the uterus and sperm cannot reach the egg. Tubal ligation is also known as getting your "tubes tied." Tubal ligation is done so you will not be able to get pregnant or have a baby.  Although this procedure may be reversed, it should be considered permanent and irreversible. If you want to have future pregnancies, you should not have this procedure.  LET YOUR CAREGIVER KNOW ABOUT:  Allergies to food or medicine.  Medicines taken, including vitamins, herbs, eyedrops, over-the-counter medicines, and creams.  Use of steroids (by mouth or creams).  Previous problems with numbing medicines.  History of bleeding problems or blood clots.  Any recent colds or infections.  Previous surgery.  Other health problems, including diabetes and kidney problems.  Possibility of pregnancy, if this applies.  Any past pregnancies. RISKS AND COMPLICATIONS   Infection.  Bleeding.  Injury to surrounding organs.  Anesthetic side effects.  Failure of the procedure.  Ectopic pregnancy.  Future regret about having the procedure done. BEFORE THE PROCEDURE  Do not take aspirin or blood thinners a week before the procedure or as directed. This can cause bleeding.  Do not eat or drink anything 6 to 8 hours before the procedure. PROCEDURE   You may be given a medicine to help you relax (sedative) before the procedure. You will be given a medicine to make you sleep (general anesthetic) during the procedure.  A tube will be put down your throat to help your breath while under general anesthesia.  Two small cuts (incisions) are made in the lower abdominal area and near the belly button.  Your abdominal area will be inflated with a safe gas (carbon dioxide). This helps  give the surgeon room to operate, visualize, and helps the surgeon avoid other organs.  A thin, lighted tube (laparoscope) with a camera attached is inserted into your abdomen through one of the incisions near the belly button. Other small instruments are also inserted through the other abdominal incision.  The fallopian tubes are located and are either blocked with a ring, clip, or are burned (cauterized).  After the fallopian tubes are blocked, the gas is released from the abdomen.  The incisions will be closed with stitches (sutures), and a bandage may be placed over the incisions. AFTER THE PROCEDURE   You will rest in a recovery room for 1 4 hours until you are stable and doing well.  You will also have some mild abdominal discomfort for 3 7 days. You will be given pain medicine to ease any discomfort.  As long as there are no problems, you may be allowed to go home. Someone will need to drive you home and be with you for at least 24 hours once home.  You may have some mild discomfort in the throat. This is from the tube placed in your throat while you were sleeping.  You may experience discomfort in the shoulder area from some trapped air between the liver and diaphragm. This sensation is normal and will slowly go away on its own. Document Released: 12/11/2000 Document Revised: 03/05/2012 Document Reviewed: 12/16/2011 [employer] Patient Information 2013 [employer].  

## 2012-12-11 ENCOUNTER — Encounter (HOSPITAL_COMMUNITY)
Admission: RE | Admit: 2012-12-11 | Discharge: 2012-12-11 | Disposition: A | Discharge status: Home / Self Care | Payer: Medicaid Other | Source: Ambulatory Visit | Attending: Obstetrics & Gynecology | Admitting: Obstetrics & Gynecology

## 2012-12-11 ENCOUNTER — Encounter (HOSPITAL_COMMUNITY): Payer: Self-pay

## 2012-12-11 LAB — CBC
HCT: 34 % — ABNORMAL LOW (ref 36.0–46.0)
MCH: 22.6 pg — ABNORMAL LOW (ref 26.0–34.0)
MCV: 71.3 fL — ABNORMAL LOW (ref 78.0–100.0)
RBC: 4.77 MIL/uL (ref 3.87–5.11)
WBC: 11.3 10*3/uL — ABNORMAL HIGH (ref 4.0–10.5)

## 2012-12-11 NOTE — Patient Instructions (Addendum)
18 Brandi Shaw  12/11/2012   Your procedure is scheduled on:  12/12/12  Enter through the Main Entrance of [facility] at 3:30 PM/ AM.  Pick up the phone at the desk and dial [phone].   Call this number if you have problems the morning of surgery: [phone]   Remember:   Do not eat food:After Midnight.  Do not drink clear liquids: 4 Hours before arrival.  Take these medicines the morning of surgery with A SIP OF WATER: NA   Do not wear jewelry, make-up or nail polish.  Do not wear lotions, powders, or perfumes. You may wear deodorant.  Do not shave 48 hours prior to surgery.  Do not bring valuables to the hospital.  Contacts, dentures or bridgework may not be worn into surgery.  Leave suitcase in the car. After surgery it may be brought to your room.  For patients admitted to the hospital, checkout time is 11:00 AM the day of discharge.   Patients discharged the day of surgery will not be allowed to drive home.  Name and phone number of your driver: boyfriend   Alycia Rossetti  Special Instructions: Shower using CHG 2 nights before surgery and the night before surgery.  If you shower the day of surgery use CHG.  Use special wash - you have one bottle of CHG for all showers.  You should use approximately 1/3 of the bottle for each shower.   Please read over the following fact sheets that you were given: Surgical Site Infection Prevention

## 2012-12-12 ENCOUNTER — Encounter (HOSPITAL_COMMUNITY): Payer: Self-pay | Admitting: Anesthesiology

## 2012-12-12 ENCOUNTER — Ambulatory Visit (HOSPITAL_COMMUNITY): Payer: Medicaid Other | Admitting: Anesthesiology

## 2012-12-12 ENCOUNTER — Encounter (HOSPITAL_COMMUNITY)
Admission: RE | Disposition: A | Discharge status: Home / Self Care | Payer: Self-pay | Source: Ambulatory Visit | Attending: Obstetrics & Gynecology

## 2012-12-12 ENCOUNTER — Ambulatory Visit (HOSPITAL_COMMUNITY)
Admission: RE | Admit: 2012-12-12 | Discharge: 2012-12-12 | Disposition: A | Discharge status: Home / Self Care | Payer: Medicaid Other | Source: Ambulatory Visit | Attending: Obstetrics & Gynecology | Admitting: Obstetrics & Gynecology

## 2012-12-12 DIAGNOSIS — D649 Anemia, unspecified: Secondary | ICD-10-CM | POA: Insufficient documentation

## 2012-12-12 DIAGNOSIS — Z302 Encounter for sterilization: Secondary | ICD-10-CM

## 2012-12-12 DIAGNOSIS — J45909 Unspecified asthma, uncomplicated: Secondary | ICD-10-CM | POA: Insufficient documentation

## 2012-12-12 DIAGNOSIS — Z8619 Personal history of other infectious and parasitic diseases: Secondary | ICD-10-CM | POA: Insufficient documentation

## 2012-12-12 HISTORY — PX: LAPAROSCOPIC TUBAL LIGATION: SHX1937

## 2012-12-12 LAB — PREGNANCY, URINE: Preg Test, Ur: NEGATIVE

## 2012-12-12 SURGERY — LIGATION, FALLOPIAN TUBE, LAPAROSCOPIC
Anesthesia: General | Site: Abdomen | Laterality: Bilateral | Wound class: Clean

## 2012-12-12 MED ORDER — GLYCOPYRROLATE 0.2 MG/ML IJ SOLN
INTRAMUSCULAR | Status: AC
  Filled 2012-12-12: qty 2

## 2012-12-12 MED ORDER — DOCUSATE SODIUM 100 MG PO CAPS: 100.0000 mg | ORAL_CAPSULE | Freq: Two times a day (BID) | ORAL | Status: AC | PRN

## 2012-12-12 MED ORDER — PROMETHAZINE HCL 25 MG/ML IJ SOLN: 6.2500 mg | INTRAMUSCULAR | Status: DC | PRN

## 2012-12-12 MED ORDER — OXYCODONE-ACETAMINOPHEN 5-325 MG PO TABS
ORAL_TABLET | ORAL | Status: AC
  Filled 2012-12-12: qty 1

## 2012-12-12 MED ORDER — IBUPROFEN 600 MG PO TABS: 600.0000 mg | ORAL_TABLET | Freq: Four times a day (QID) | ORAL | Status: AC | PRN

## 2012-12-12 MED ORDER — FENTANYL CITRATE 0.05 MG/ML IJ SOLN: 25.0000 ug | INTRAMUSCULAR | Status: DC | PRN

## 2012-12-12 MED ORDER — ROCURONIUM BROMIDE 100 MG/10ML IV SOLN
INTRAVENOUS | Status: DC | PRN
  Administered 2012-12-12: 5 mg via INTRAVENOUS
  Administered 2012-12-12: 25 mg via INTRAVENOUS

## 2012-12-12 MED ORDER — BUPIVACAINE HCL (PF) 0.5 % IJ SOLN
INTRAMUSCULAR | Status: DC | PRN
  Administered 2012-12-12: 30 mL

## 2012-12-12 MED ORDER — FENTANYL CITRATE 0.05 MG/ML IJ SOLN
INTRAMUSCULAR | Status: AC
  Filled 2012-12-12: qty 5

## 2012-12-12 MED ORDER — ONDANSETRON HCL 4 MG/2ML IJ SOLN
INTRAMUSCULAR | Status: DC | PRN
  Administered 2012-12-12: 4 mg via INTRAVENOUS

## 2012-12-12 MED ORDER — ONDANSETRON HCL 4 MG/2ML IJ SOLN
INTRAMUSCULAR | Status: AC
  Filled 2012-12-12: qty 2

## 2012-12-12 MED ORDER — NEOSTIGMINE METHYLSULFATE 1 MG/ML IJ SOLN
INTRAMUSCULAR | Status: AC
  Filled 2012-12-12: qty 1

## 2012-12-12 MED ORDER — KETOROLAC TROMETHAMINE 30 MG/ML IJ SOLN
INTRAMUSCULAR | Status: AC
  Administered 2012-12-12: 30 mg via INTRAVENOUS
  Filled 2012-12-12: qty 1

## 2012-12-12 MED ORDER — MIDAZOLAM HCL 2 MG/2ML IJ SOLN
INTRAMUSCULAR | Status: AC
  Filled 2012-12-12: qty 2

## 2012-12-12 MED ORDER — OXYCODONE-ACETAMINOPHEN 5-325 MG PO TABS
1.0000 | ORAL_TABLET | ORAL | Status: DC | PRN
  Administered 2012-12-12: 1 via ORAL

## 2012-12-12 MED ORDER — DEXAMETHASONE SODIUM PHOSPHATE 10 MG/ML IJ SOLN
INTRAMUSCULAR | Status: AC
  Filled 2012-12-12: qty 1

## 2012-12-12 MED ORDER — GLYCOPYRROLATE 0.2 MG/ML IJ SOLN
INTRAMUSCULAR | Status: AC
  Filled 2012-12-12: qty 1

## 2012-12-12 MED ORDER — LIDOCAINE HCL (CARDIAC) 20 MG/ML IV SOLN
INTRAVENOUS | Status: AC
  Filled 2012-12-12: qty 5

## 2012-12-12 MED ORDER — OXYCODONE-ACETAMINOPHEN 5-325 MG PO TABS: 1.0000 | ORAL_TABLET | Freq: Four times a day (QID) | ORAL | Status: DC | PRN

## 2012-12-12 MED ORDER — BUPIVACAINE HCL (PF) 0.5 % IJ SOLN
INTRAMUSCULAR | Status: AC
  Filled 2012-12-12: qty 30

## 2012-12-12 MED ORDER — MIDAZOLAM HCL 2 MG/2ML IJ SOLN: 0.5000 mg | Freq: Once | INTRAMUSCULAR | Status: DC | PRN

## 2012-12-12 MED ORDER — LACTATED RINGERS IV SOLN
INTRAVENOUS | Status: DC
  Administered 2012-12-12 (×3): via INTRAVENOUS

## 2012-12-12 MED ORDER — DEXAMETHASONE SODIUM PHOSPHATE 10 MG/ML IJ SOLN
INTRAMUSCULAR | Status: DC | PRN
  Administered 2012-12-12: 10 mg via INTRAVENOUS

## 2012-12-12 MED ORDER — ROCURONIUM BROMIDE 50 MG/5ML IV SOLN
INTRAVENOUS | Status: AC
  Filled 2012-12-12: qty 1

## 2012-12-12 MED ORDER — PROPOFOL 10 MG/ML IV EMUL
INTRAVENOUS | Status: AC
  Filled 2012-12-12: qty 20

## 2012-12-12 MED ORDER — PROPOFOL 10 MG/ML IV BOLUS
INTRAVENOUS | Status: DC | PRN
  Administered 2012-12-12: 40 mg via INTRAVENOUS
  Administered 2012-12-12: 160 mg via INTRAVENOUS

## 2012-12-12 MED ORDER — NEOSTIGMINE METHYLSULFATE 1 MG/ML IJ SOLN
INTRAMUSCULAR | Status: DC | PRN
  Administered 2012-12-12: 2 mg via INTRAVENOUS

## 2012-12-12 MED ORDER — MIDAZOLAM HCL 5 MG/5ML IJ SOLN
INTRAMUSCULAR | Status: DC | PRN
  Administered 2012-12-12: 2 mg via INTRAVENOUS

## 2012-12-12 MED ORDER — MEPERIDINE HCL 25 MG/ML IJ SOLN: 6.2500 mg | INTRAMUSCULAR | Status: DC | PRN

## 2012-12-12 MED ORDER — KETOROLAC TROMETHAMINE 30 MG/ML IJ SOLN
15.0000 mg | Freq: Once | INTRAMUSCULAR | Status: AC | PRN
  Administered 2012-12-12: 30 mg via INTRAVENOUS

## 2012-12-12 MED ORDER — GLYCOPYRROLATE 0.2 MG/ML IJ SOLN
INTRAMUSCULAR | Status: DC | PRN
  Administered 2012-12-12: 0.4 mg via INTRAVENOUS
  Administered 2012-12-12: 0.2 mg via INTRAVENOUS

## 2012-12-12 MED ORDER — FENTANYL CITRATE 0.05 MG/ML IJ SOLN
INTRAMUSCULAR | Status: DC | PRN
  Administered 2012-12-12 (×2): 100 ug via INTRAVENOUS
  Administered 2012-12-12: 50 ug via INTRAVENOUS

## 2012-12-12 SURGICAL SUPPLY — 21 items
ADH SKN CLS APL DERMABOND .7 (GAUZE/BANDAGES/DRESSINGS) ×1
CATH FOLEY LATEX FREE 14FR (CATHETERS) ×2
CATH FOLEY LF 14FR (CATHETERS) ×1 IMPLANT
CATH ROBINSON RED A/P 16FR (CATHETERS) IMPLANT
CHLORAPREP W/TINT 26ML (MISCELLANEOUS) ×2 IMPLANT
CLIP FILSHIE TUBAL LIGA STRL (Clip) ×2 IMPLANT
CLOTH BEACON ORANGE TIMEOUT ST (SAFETY) ×2 IMPLANT
DERMABOND ADVANCED (GAUZE/BANDAGES/DRESSINGS) ×1
DERMABOND ADVANCED .7 DNX12 (GAUZE/BANDAGES/DRESSINGS) ×1 IMPLANT
DRSG COVADERM PLUS 2X2 (GAUZE/BANDAGES/DRESSINGS) ×2 IMPLANT
GLOVE BIO SURGEON STRL SZ7 (GLOVE) ×2 IMPLANT
GLOVE BIOGEL PI IND STRL 7.0 (GLOVE) ×1 IMPLANT
GLOVE BIOGEL PI INDICATOR 7.0 (GLOVE) ×1
GOWN PREVENTION PLUS LG XLONG (DISPOSABLE) ×4 IMPLANT
NEEDLE INSUFFLATION 120MM (ENDOMECHANICALS) ×2 IMPLANT
PACK LAPAROSCOPY BASIN (CUSTOM PROCEDURE TRAY) ×2 IMPLANT
SUT VIC AB 3-0 X1 27 (SUTURE) ×2 IMPLANT
SUT VICRYL 0 UR6 27IN ABS (SUTURE) ×4 IMPLANT
TOWEL OR 17X24 6PK STRL BLUE (TOWEL DISPOSABLE) ×4 IMPLANT
TROCAR XCEL NON-BLD 11X100MML (ENDOMECHANICALS) ×2 IMPLANT
WATER STERILE IRR 1000ML POUR (IV SOLUTION) IMPLANT

## 2012-12-12 NOTE — Anesthesia Preprocedure Evaluation (Signed)
Anesthesia Evaluation  Patient identified by MRN, date of birth, ID band Patient awake    Reviewed: Allergy & Precautions, H&P , Patient's Chart, lab work & pertinent test results, reviewed documented beta blocker date and time   History of Anesthesia Complications Negative for: history of anesthetic complications  Airway Mallampati: II TM Distance: >3 FB Neck ROM: full    Dental no notable dental hx.    Pulmonary neg pulmonary ROS, asthma ,  breath sounds clear to auscultation  Pulmonary exam normal       Cardiovascular Exercise Tolerance: Good negative cardio ROS  Rhythm:regular Rate:Normal     Neuro/Psych negative neurological ROS  negative psych ROS   GI/Hepatic negative GI ROS, Neg liver ROS,   Endo/Other  negative endocrine ROS  Renal/GU negative Renal ROS     Musculoskeletal   Abdominal   Peds  Hematology negative hematology ROS (+) anemia ,   Anesthesia Other Findings Trichimoniasis     Anemia        Asthma   Albuterol as needed Preterm labor        Abnormal Pap smear 2006 had colposcopy>nml    Reproductive/Obstetrics negative OB ROS                           Anesthesia Physical Anesthesia Plan  ASA: II  Anesthesia Plan: General ETT   Post-op Pain Management:    Induction:   Airway Management Planned:   Additional Equipment:   Intra-op Plan:   Post-operative Plan:   Informed Consent: I have reviewed the patients History and Physical, chart, labs and discussed the procedure including the risks, benefits and alternatives for the proposed anesthesia with the patient or authorized representative who has indicated his/her understanding and acceptance.   Dental Advisory Given  Plan Discussed with: CRNA and Surgeon  Anesthesia Plan Comments:         Anesthesia Quick Evaluation

## 2012-12-12 NOTE — Interval H&P Note (Signed)
History and Physical Interval Note 12/12/2012 3:03 PM  Brandi Shaw  has presented today for surgery, with the diagnosis of undesired fertility.  Patient desires permanent sterilization.  Other reversible forms of contraception were discussed with patient; she declines all other modalities. Risks of procedure discussed with patient including but not limited to: risk of regret, permanence of method, bleeding, infection, injury to surrounding organs and need for additional procedures.  Failure risk of 1-2% with increased risk of ectopic gestation if pregnancy occurs was also discussed with patient.  Patient verbalized understanding of these risks and wants to proceed with sterilization.  The patient's history has been reviewed, patient examined, no change in status, stable for surgery.  I have reviewed the patient's chart and labs.  Questions were answered to the patient's satisfaction.  After consideration of risks, benefits and other options for treatment, the patient has consented to Procedure(s): LAPAROSCOPIC BILATERAL TUBAL STERILIZATION USING FILSHIE CLIPS as a surgical intervention.  To OR when ready.  Jaynie Collins, MD, FACOG Attending Obstetrician & Gynecologist Faculty Practice, [facility]

## 2012-12-12 NOTE — Anesthesia Postprocedure Evaluation (Signed)
  Anesthesia Post Note  Patient: Brandi Shaw  Procedure(s) Performed: Procedure(s) (LRB): LAPAROSCOPIC TUBAL LIGATION (Bilateral)  Anesthesia type: GA  Patient location: PACU  Post pain: Pain level controlled  Post assessment: Post-op Vital signs reviewed  Last Vitals:  Filed Vitals:   12/12/12 1630  BP: 117/71  Pulse: 71  Temp:   Resp: 16    Post vital signs: Reviewed  Level of consciousness: sedated  Complications: No apparent anesthesia complications

## 2012-12-12 NOTE — H&P (View-Only) (Signed)
Patient ID: Brandi Shaw, female   DOB: 08/25/1991, 22 y.o.   MRN: [id number] Subjective:     Brandi Shaw is a 22 y.o. female who presents for a postpartum visit. She is 6 weeks postpartum following a spontaneous vaginal delivery. I have fully reviewed the prenatal and intrapartum course. The delivery was at 39 gestational weeks. Outcome: spontaneous vaginal delivery. Postpartum course has been uncomplicated. Baby's course has been benign. Baby is feeding by both breast and bottle - unk. Bleeding no bleeding. Bowel function is normal. Bladder function is normal. Patient is sexually active. Contraception method is condoms. Postpartum depression screening: negative.  The following portions of the patient's history were reviewed and updated as appropriate: allergies, current medications, past family history, past medical history, past social history, past surgical history and problem list.  Review of Systems A comprehensive review of systems was negative.   Objective:    BP 132/85  Pulse 60  Temp(Src) 97.8 F (36.6 C) (Oral)  Resp 20  Ht 5\' 5"  (1.651 m)  Wt 165 lb 14.4 oz (75.252 kg)  BMI 27.61 kg/m2  LMP 11/19/2012  Breastfeeding? Yes  General:  alert and no distress           Abdomen: soft, non-tender; bowel sounds normal; no masses,  no organomegaly   Vulva:  normal  Vagina: normal vagina  Cervix:  no cervical motion tenderness  Corpus: normal size, contour, position, consistency, mobility, non-tender  Adnexa:  normal adnexa           Assessment:    6 week postpartum exam. Pap smear not done at today's visit.   Plan:    1. Contraception: condoms 2. Pt planning for interval sterilization 12/12/2012 The risks of surgery were discussed in detail with the patient including but not limited to: bleeding which may require transfusion or reoperation; infection which may require prolonged hospitalization or re-hospitalization and antibiotic therapy; injury to bowel,  bladder, ureters and major vessels or other surrounding organs; need for additional procedures including laparotomy; thromboembolic phenomenon, incisional problems and other postoperative or anesthesia complications.  Patient was told that the likelihood that her condition and symptoms will be treated effectively with this surgical management was very high; the postoperative expectations were also discussed in detail. The patient also understands the alternative treatment options which were discussed in full. All questions were answered.  3. Follow up in: 2 week post partum or as needed.

## 2012-12-12 NOTE — Anesthesia Procedure Notes (Signed)
Procedure Name: Intubation Date/Time: 12/12/2012 3:28 PM Performed by: Graciela Husbands Pre-anesthesia Checklist: Patient being monitored, Suction available, Emergency Drugs available, Patient identified and Timeout performed Patient Re-evaluated:Patient Re-evaluated prior to inductionOxygen Delivery Method: Circle system utilized Preoxygenation: Pre-oxygenation with 100% oxygen Intubation Type: IV induction Ventilation: Mask ventilation without difficulty Laryngoscope Size: Mac and 3 Grade View: Grade I Tube type: Oral Tube size: 7.0 mm Number of attempts: 1 Airway Equipment and Method: Stylet Placement Confirmation: ETT inserted through vocal cords under direct vision,  breath sounds checked- equal and bilateral and positive ETCO2 Secured at: 2 cm Tube secured with: Tape Dental Injury: Teeth and Oropharynx as per pre-operative assessment

## 2012-12-12 NOTE — Transfer of Care (Signed)
Immediate Anesthesia Transfer of Care Note  Patient: Brandi Shaw  Procedure(s) Performed: Procedure(s): LAPAROSCOPIC TUBAL LIGATION (Bilateral)  Patient Location: PACU  Anesthesia Type:General  Level of Consciousness: awake, alert  and oriented  Airway & Oxygen Therapy: Patient Spontanous Breathing and Patient connected to nasal cannula oxygen  Post-op Assessment: Report given to PACU RN and Post -op Vital signs reviewed and stable  Post vital signs: Reviewed and stable  Complications: No apparent anesthesia complications

## 2012-12-12 NOTE — Op Note (Signed)
Brandi Shaw 12/12/2012  PREOPERATIVE DIAGNOSIS:  Undesired fertility  POSTOPERATIVE DIAGNOSIS:  Undesired fertility  PROCEDURE:  Laparoscopic Bilateral Tubal Sterilization using Filshie Clips   SURGEON: Jaynie Collins, MD  ANESTHESIA:  General endotracheal  COMPLICATIONS:  None immediate.  ESTIMATED BLOOD LOSS:  Less than 20 ml.  INDICATIONS: 22 y.o. [id number]  with undesired fertility, desires permanent sterilization. Other reversible forms of contraception were discussed with patient; she declines all other modalities.  Risks of procedure discussed with patient including permanence of method, bleeding, infection, injury to surrounding organs and need for additional procedures including laparotomy, risk of regret.  Failure risk of 1-2% with increased risk of ectopic gestation if pregnancy occurs was also discussed with patient.      FINDINGS:  Normal uterus, tubes, and ovaries.   TECHNIQUE:  The patient was taken to the operating room where general anesthesia was obtained without difficulty.  She was then placed in the dorsal lithotomy position and prepared and draped in sterile fashion.  After an adequate timeout was performed, a bivalved speculum was then placed in the patient's vagina, and the anterior lip of cervix grasped with the single-tooth tenaculum.  The uterine manipulator was then advanced into the uterus.  The speculum was removed from the vagina.  Attention was then turned to the patient's abdomen where a 11-mm skin incision was made in the umbilical fold.  The Veress needle was carefully introduced into the peritoneal cavity through the abdominal wall.  Intraperitoneal placement was confirmed by drop in intraabdominal pressure with insufflation of carbon dioxide gas.  Adequate pneumoperitoneum was obtained, and the 11-mm trocar and sleeve were then advanced without difficulty into the abdomen where intraabdominal placement was confirmed by the operative laparoscope. A  survey of the patient's pelvis and abdomen revealed entirely normal anatomy.  The fallopian tubes were observed and found to be normal in appearance. The Filshie clip applicator was placed through the operative port, and a Filshie clip was placed on the right fallopian tube, about 2 cm from the cornual attachment, with care given to incorporate the underlying mesosalpinx.  A similar process was carried out on the contralateral side allowing for bilateral tubal sterilization.   Good hemostasis was noted overall.  Local analgesia was drizzled on both operative sites.The instruments were then removed from the patient's abdomen and the fascial incision was repaired with 0 Vicryl, and the skin was closed with a 3-0 Vicryl subcuticular stitch.  The uterine manipulator and the tenaculum were removed from the vagina without complications. The patient tolerated the procedure well.  Sponge, lap, and needle counts were correct times two.  The patient was then taken to the recovery room awake, extubated and in stable condition.  The patient will be discharged to home as per PACU criteria.  Routine postoperative instructions given.  She was prescribed Percocet, Ibuprofen and Colace.  She will follow up in the clinic on 12/26/2012 for postoperative evaluation.

## 2012-12-13 ENCOUNTER — Encounter (HOSPITAL_COMMUNITY): Payer: Self-pay | Admitting: Obstetrics & Gynecology

## 2012-12-26 ENCOUNTER — Ambulatory Visit: Payer: Medicaid Other | Admitting: Obstetrics & Gynecology

## 2013-01-01 ENCOUNTER — Ambulatory Visit: Payer: Medicaid Other | Admitting: Obstetrics & Gynecology

## 2013-02-06 ENCOUNTER — Encounter: Payer: Self-pay | Admitting: *Deleted

## 2013-08-15 IMAGING — US US OB COMP +14 WK
1 series · 12 of 28 positions shown · non-contrast
Comparison: none

[Series 1: us ob comp +14 wk · 12 of 73 slices shown]
[im 3/73]
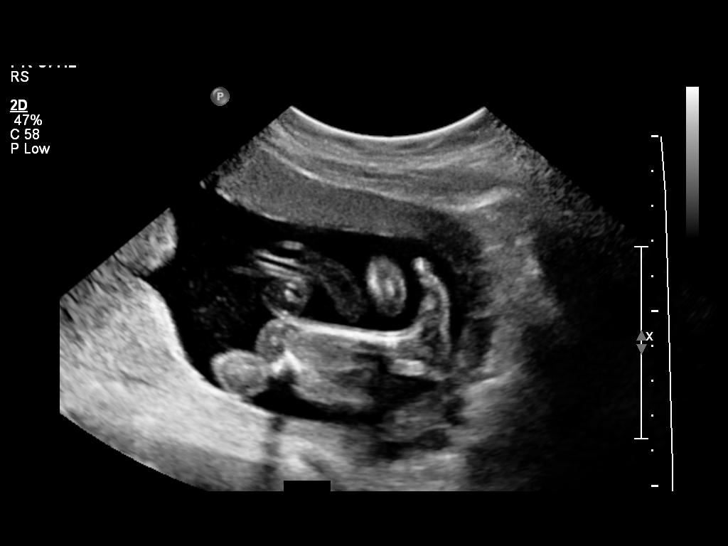
[im 9/73]
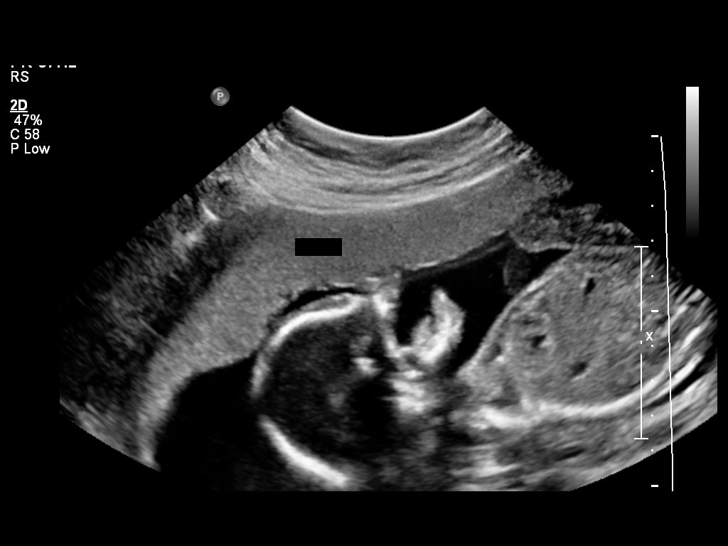
[im 14/73]
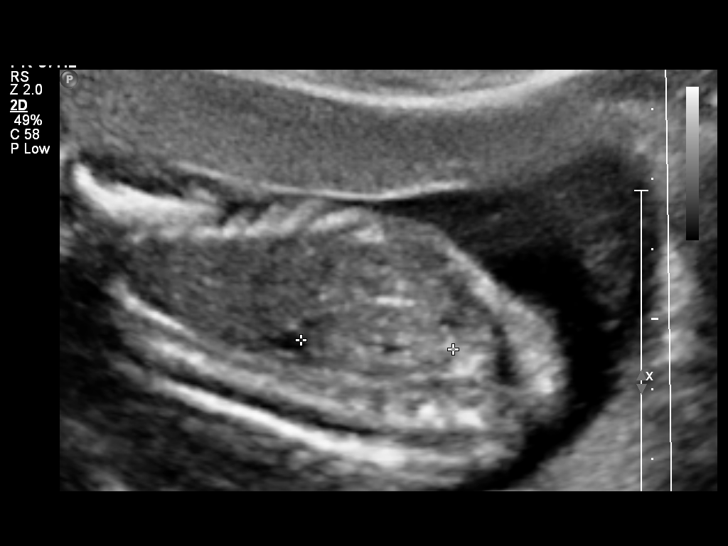
[im 22/73]
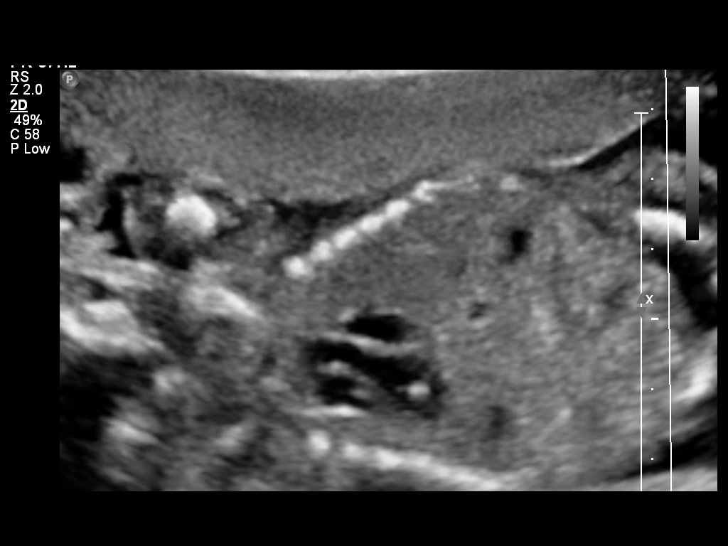
[im 27/73]
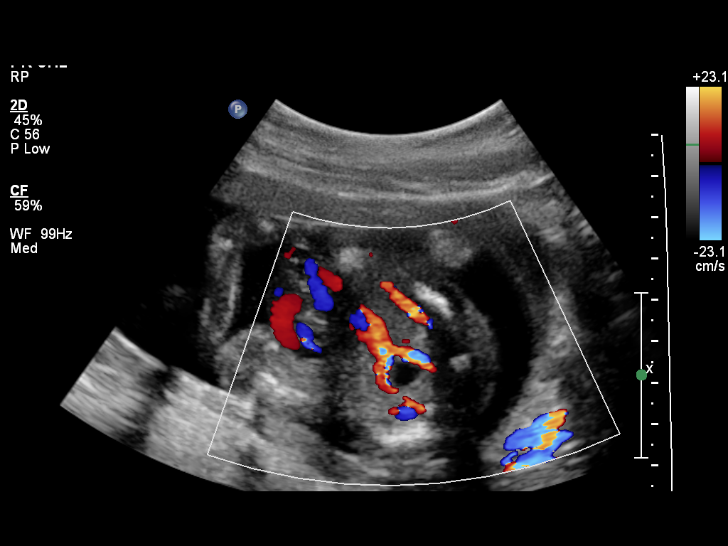
[im 33/73]
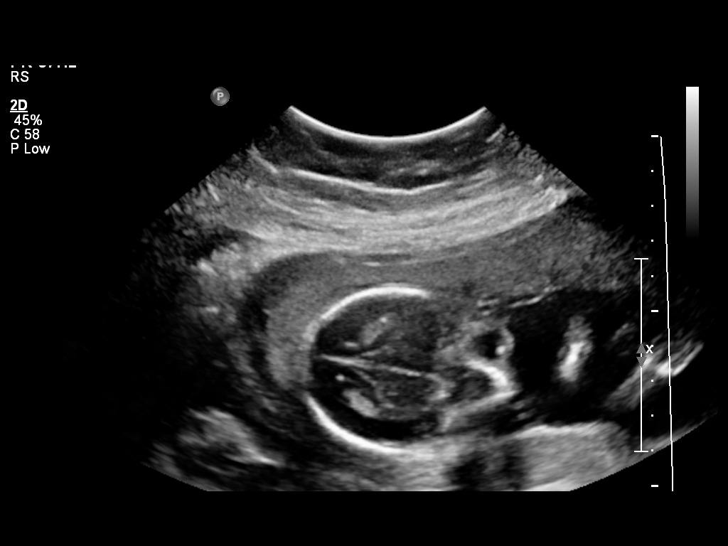
[im 41/73]
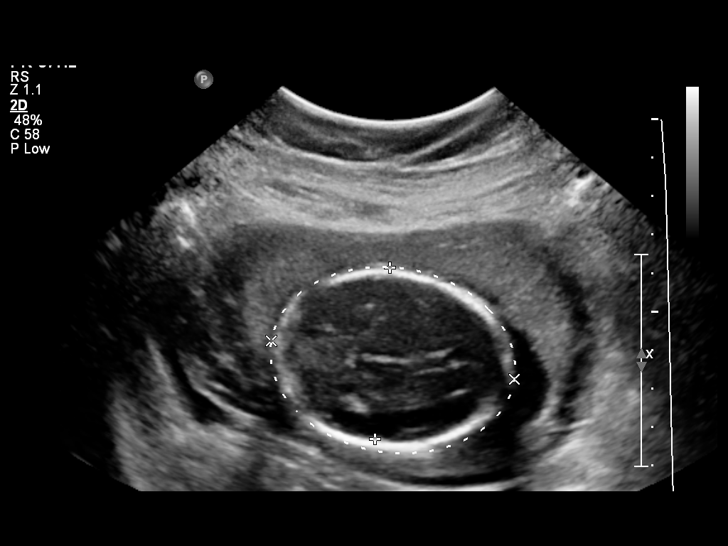
[im 46/73]
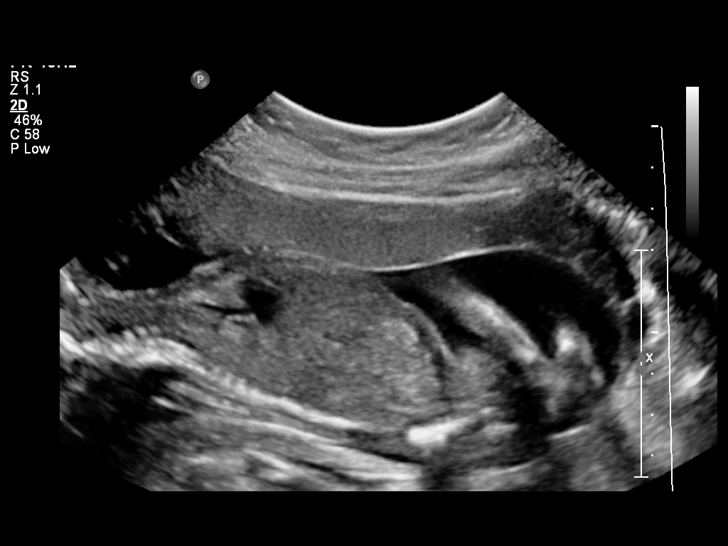
[im 51/73]
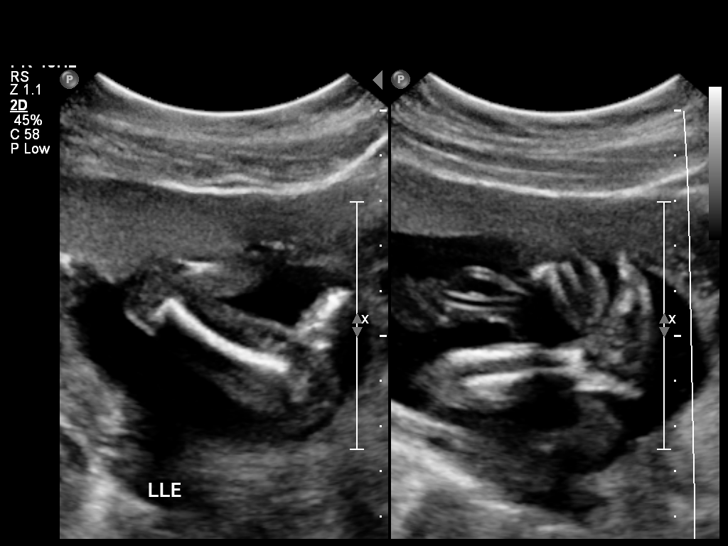
[im 59/73]
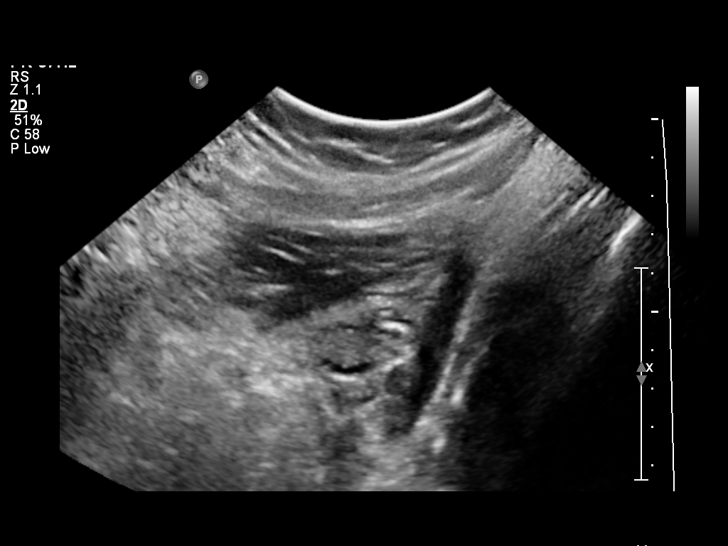
[im 65/73]
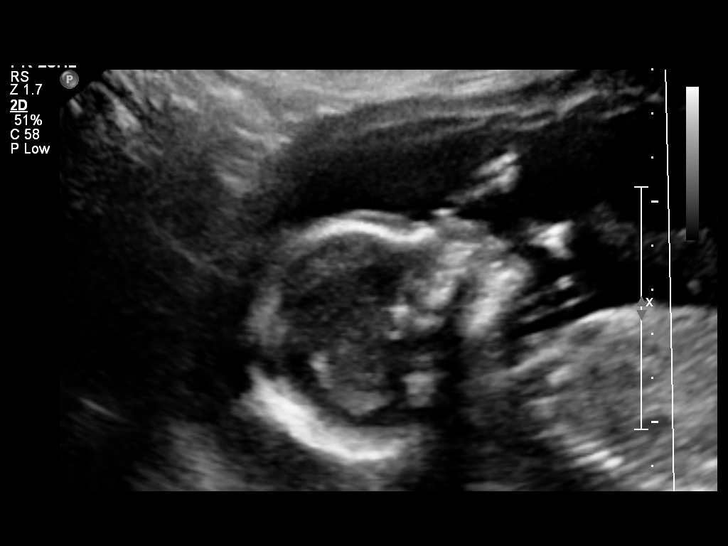
[im 70/73]
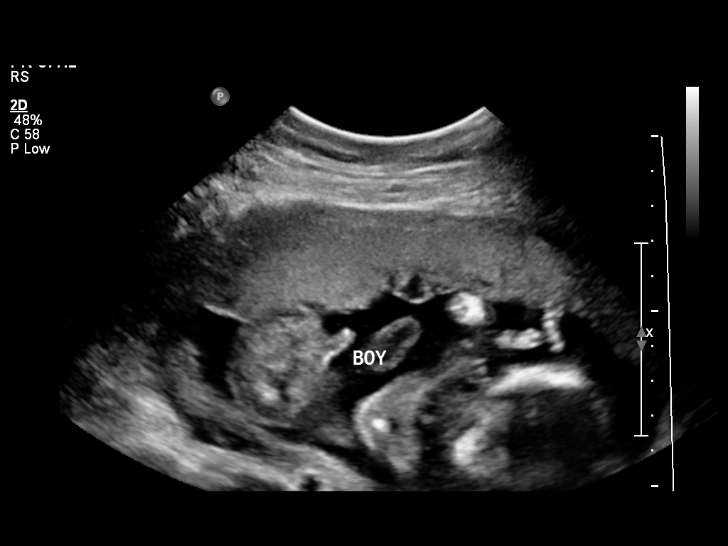

[12 of 28 positions shown; findings below may reference images not displayed]

OBSTETRICS REPORT
                      (Signed Final 06/06/2012 [DATE])

             [facility]

Service(s) Provided

 US OB COMP + 14 WK                                    76805.1
Indications

 Basic anatomic survey
Fetal Evaluation

 Num Of Fetuses:    1
 Fetal Heart Rate:  158                         bpm
 Cardiac Activity:  Observed
 Presentation:      Breech
 Placenta:          Anterior Fundal, above
                    cervical os

 Amniotic Fluid
 AFI FV:      Subjectively within normal limits
                                             Larg Pckt:     3.4  cm
Biometry

 BPD:     44.5  mm    G. Age:   19w 3d                CI:         73.7   70 - 86
 OFD:     60.4  mm                                    FL/HC:      17.8   16.8 -

 HC:     173.6  mm    G. Age:   19w 6d       58  %    HC/AC:      1.15   1.09 -

 AC:     150.8  mm    G. Age:   20w 2d       69  %    FL/BPD:
 FL:      30.9  mm    G. Age:   19w 4d       43  %    FL/AC:      20.5   20 - 24
 CER:     20.6  mm    G. Age:   19w 4d       51  %
 NFT:     3.88  mm
 Est. FW:     323  gm    0 lb 11 oz      53  %
Gestational Age

 Clinical EDD:  19w 4d                                        EDD:   10/27/12
 U/S Today:     19w 6d                                        EDD:   10/25/12
 Best:          19w 4d    Det. By:   Clinical EDD             EDD:   10/27/12
Anatomy

 Cranium:          Appears normal         Aortic Arch:      Appears normal
 Fetal Cavum:      Appears normal         Ductal Arch:      Basic anatomy
                                                            exam per order
 Ventricles:       Appears normal         Diaphragm:        Appears normal
 Choroid Plexus:   Appears normal         Stomach:          Appears normal
 Cerebellum:       Appears normal         Abdomen:          Appears normal
 Posterior Fossa:  Appears normal         Abdominal Wall:   Appears nml (cord
                                                            insert, abd wall)
 Nuchal Fold:      Appears normal         Cord Vessels:     Appears normal (3
                                                            vessel cord)
 Face:             Orbits and profile     Kidneys:          Appear normal
                   appear normal
 Lips:             Appears normal         Bladder:          Appears normal
 Palate:           Appears normal         Spine:            Limited but no
                                                            intracranial signs of
                                                            NTD
 Heart:            Echogenic focus        Lower             Appears normal
                   in LV
                                          Extremities:
 RVOT:             Not well visualized    Upper             Appears normal
                                          Extremities:
 LVOT:             Appears normal

 Other:  Heels and 5th digit appears normal. Male gender.  Nasal bone
         visualized.
Cervix Uterus Adnexa

 Cervical Length:   3.34      cm

 Cervix:       Normal appearance by transabdominal scan.

 Left Ovary:   Size(cm) L: 2.54 x W: 1.78 x H: 1.49  Volume(cc):
 Right Ovary:  Size(cm) L: 2.75 x W: 1.85 x H: 1.78  Volume(cc):
Impression

 Assigned GA is currently 19w 4d by established clinical EDD.
 Concordant fetal biometry on today's exam.
 No fetal anomalies seen involving visualized anatomy.
 Echogenic intracardiac focus noted, which is a soft marker for
 Trisomy 21.
Recommendations

 Assessment of clinical risk factors and correlation with results
 of any 1st and 2nd trimester aneuploidy screening tests are
 recommended.  If patient is considered low-risk, this isolated
 finding is of unlikely significance.  If patient has other risk
 factors for aneuploidy, genetic counseling and further testing
 should be considered.  This could be scheduled at the
 1501.

## 2014-07-20 ENCOUNTER — Encounter (HOSPITAL_COMMUNITY): Payer: Self-pay | Admitting: Obstetrics & Gynecology

## 2014-10-01 ENCOUNTER — Encounter (HOSPITAL_COMMUNITY): Payer: Self-pay | Admitting: Obstetrics & Gynecology

## 2015-02-25 ENCOUNTER — Encounter (HOSPITAL_COMMUNITY): Payer: Self-pay | Admitting: Obstetrics & Gynecology

## 2017-04-16 ENCOUNTER — Encounter (HOSPITAL_COMMUNITY): Payer: Self-pay | Admitting: Obstetrics and Gynecology

## 2017-04-16 ENCOUNTER — Inpatient Hospital Stay (HOSPITAL_COMMUNITY)
Admission: AD | Admit: 2017-04-16 | Discharge: 2017-04-16 | Disposition: A | Discharge status: Home / Self Care | Payer: Self-pay | Source: Ambulatory Visit | Attending: Obstetrics & Gynecology | Admitting: Obstetrics & Gynecology

## 2017-04-16 DIAGNOSIS — Z880 Allergy status to penicillin: Secondary | ICD-10-CM | POA: Insufficient documentation

## 2017-04-16 DIAGNOSIS — Z813 Family history of other psychoactive substance abuse and dependence: Secondary | ICD-10-CM | POA: Insufficient documentation

## 2017-04-16 DIAGNOSIS — B9689 Other specified bacterial agents as the cause of diseases classified elsewhere: Secondary | ICD-10-CM

## 2017-04-16 DIAGNOSIS — N760 Acute vaginitis: Secondary | ICD-10-CM | POA: Diagnosis present

## 2017-04-16 DIAGNOSIS — Z91040 Latex allergy status: Secondary | ICD-10-CM | POA: Insufficient documentation

## 2017-04-16 DIAGNOSIS — Z87891 Personal history of nicotine dependence: Secondary | ICD-10-CM | POA: Insufficient documentation

## 2017-04-16 DIAGNOSIS — J45909 Unspecified asthma, uncomplicated: Secondary | ICD-10-CM | POA: Insufficient documentation

## 2017-04-16 DIAGNOSIS — Z833 Family history of diabetes mellitus: Secondary | ICD-10-CM | POA: Insufficient documentation

## 2017-04-16 HISTORY — DX: Other specified bacterial agents as the cause of diseases classified elsewhere: B96.89

## 2017-04-16 HISTORY — DX: Acute vaginitis: N76.0

## 2017-04-16 LAB — URINALYSIS, ROUTINE W REFLEX MICROSCOPIC
BILIRUBIN URINE: NEGATIVE
Glucose, UA: NEGATIVE mg/dL
KETONES UR: NEGATIVE mg/dL
NITRITE: NEGATIVE
PH: 5 (ref 5.0–8.0)
PROTEIN: 30 mg/dL — AB
Specific Gravity, Urine: 1.02 (ref 1.005–1.030)

## 2017-04-16 LAB — POCT PREGNANCY, URINE: PREG TEST UR: NEGATIVE

## 2017-04-16 LAB — WET PREP, GENITAL
SPERM: NONE SEEN
TRICH WET PREP: NONE SEEN
YEAST WET PREP: NONE SEEN

## 2017-04-16 MED ORDER — METRONIDAZOLE 500 MG PO TABS: 500.0000 mg | ORAL_TABLET | Freq: Two times a day (BID) | ORAL | 0 refills | Status: AC

## 2017-04-16 NOTE — MAU Note (Signed)
+  increase urinary frequency Denies odor Dysuria Going on since saturday

## 2017-04-16 NOTE — MAU Provider Note (Signed)
History     CSN: [id number]  Arrival date and time: 04/16/17 1542   First Provider Initiated Contact with Patient 04/16/17 1840     Chief Complaint  Patient presents with  . Dysuria  . Urinary Frequency   HPI  Ms. Brandi Shaw is a 26 yo [phone] non-pregnant female presenting to MAU with complaints of increased urinary frequency and dysuria since Saturday.  She denies any vaginal d/c, vaginal odor, bleeding, or pain.  Past Medical History:  Diagnosis Date  . Abnormal Pap smear 2006   had colposcopy>nml  . Anemia   . Asthma    Albuterol as needed  . Preterm labor   . Trichimoniasis     Past Surgical History:  Procedure Laterality Date  . COLPOSCOPY    . LAPAROSCOPIC TUBAL LIGATION Bilateral 12/12/2012   Procedure: LAPAROSCOPIC TUBAL LIGATION;  Surgeon: Tereso NewcomerUgonna A Anyanwu, MD;  Location: [facility];  Service: Gynecology;  Laterality: Bilateral;  . TONSILLECTOMY      Family History  Problem Relation Age of Onset  . Diabetes Mother   . Drug abuse Mother        crack  . Other Neg Hx   . Diabetes Maternal Grandmother   . Asthma Brother   . Asthma Brother   . Asthma Brother     Social History  Substance Use Topics  . Smoking status: Former Smoker    Quit date: 03/15/2008  . Smokeless tobacco: Not on file  . Alcohol use No    Allergies:  Allergies  Allergen Reactions  . Banana Hives  . Latex Hives  . Penicillins Hives    Prescriptions Prior to Admission  Medication Sig Dispense Refill Last Dose  . APAP-Pamabrom-Pyrilamine (PAMPRIN MULTI-SYMPTOM PO) Take 2 tablets by mouth as needed (menstrual cramps).   Not Taking  . docusate sodium (COLACE) 100 MG capsule Take 1 capsule (100 mg total) by mouth 2 (two) times daily as needed for constipation. 30 capsule 2   . ibuprofen (ADVIL,MOTRIN) 600 MG tablet Take 1 tablet (600 mg total) by mouth every 6 (six) hours as needed for pain. 30 tablet 1   . oxyCODONE-acetaminophen (PERCOCET/ROXICET) 5-325 MG per tablet  Take 1-2 tablets by mouth every 6 (six) hours as needed for pain. 30 tablet 0     Review of Systems  Constitutional: Negative.   HENT: Negative.   Eyes: Negative.   Respiratory: Negative.   Cardiovascular: Negative.   Gastrointestinal: Negative.   Endocrine: Negative.   Genitourinary: Positive for dysuria and frequency.  Musculoskeletal: Negative.   Skin: Negative.   Allergic/Immunologic: Negative.   Neurological: Negative.   Hematological: Negative.   Psychiatric/Behavioral: Negative.    Physical Exam   Blood pressure 127/81, pulse 74, temperature 98.3 F (36.8 C), temperature source Oral, resp. rate 16, weight 169 lb 1.9 oz (76.7 kg), last menstrual period 04/04/2017, SpO2 100 %.  Physical Exam  Constitutional: She is oriented to person, place, and time. She appears well-developed and well-nourished.  HENT:  Head: Normocephalic.  Eyes: Pupils are equal, round, and reactive to light.  Neck: Normal range of motion.  Cardiovascular: Normal rate.   Respiratory: Effort normal.  GI: Soft.  Genitourinary:  Genitourinary Comments: Wet Prep and GC/CT specimens were self-collected by patient  Musculoskeletal: Normal range of motion.  Neurological: She is alert and oriented to person, place, and time. She has normal reflexes.  Skin: Skin is warm and dry.  Psychiatric: She has a normal mood and affect. Her behavior is normal. Judgment  and thought content normal.    MAU Course  Procedures  MDM CCUA UPT Wet Prep GC/Ct  Results for orders placed or performed during the hospital encounter of 04/16/17 (from the past 24 hour(s))  Urinalysis, Routine w reflex microscopic     Status: Abnormal   Collection Time: 04/16/17  4:57 PM  Result Value Ref Range   Color, Urine YELLOW YELLOW   APPearance HAZY (A) CLEAR   Specific Gravity, Urine 1.020 1.005 - 1.030   pH 5.0 5.0 - 8.0   Glucose, UA NEGATIVE NEGATIVE mg/dL   Hgb urine dipstick MODERATE (A) NEGATIVE   Bilirubin Urine  NEGATIVE NEGATIVE   Ketones, ur NEGATIVE NEGATIVE mg/dL   Protein, ur 30 (A) NEGATIVE mg/dL   Nitrite NEGATIVE NEGATIVE   Leukocytes, UA SMALL (A) NEGATIVE   RBC / HPF TOO NUMEROUS TO COUNT 0 - 5 RBC/hpf   WBC, UA TOO NUMEROUS TO COUNT 0 - 5 WBC/hpf   Bacteria, UA FEW (A) NONE SEEN   Squamous Epithelial / LPF 0-5 (A) NONE SEEN   Mucous PRESENT    Hyaline Casts, UA PRESENT    Non Squamous Epithelial 0-5 (A) NONE SEEN  Pregnancy, urine POC     Status: None   Collection Time: 04/16/17  5:19 PM  Result Value Ref Range   Preg Test, Ur NEGATIVE NEGATIVE  Wet prep, genital     Status: Abnormal   Collection Time: 04/16/17  6:25 PM  Result Value Ref Range   Yeast Wet Prep HPF POC NONE SEEN NONE SEEN   Trich, Wet Prep NONE SEEN NONE SEEN   Clue Cells Wet Prep HPF POC PRESENT (A) NONE SEEN   WBC, Wet Prep HPF POC FEW (A) NONE SEEN   Sperm NONE SEEN     Assessment and Plan  Bacterial vaginitis - Rx for Flagyl 500 mg po BID x 7 days - Instructions on BV and Flagyl given - F/U with [facility] prn  Discharge home Patient verbalized an understanding of the plan of care and agrees.  Raelyn Moraolitta Arno Cullers, MSN, CNM 04/16/2017, 6:45 PM

## 2017-04-16 NOTE — Discharge Instructions (Signed)
Bacterial Vaginosis Bacterial vaginosis is a vaginal infection that occurs when the normal balance of bacteria in the vagina is disrupted. It results from an overgrowth of certain bacteria. This is the most common vaginal infection among women ages 7-44. Because bacterial vaginosis increases your risk for STIs (sexually transmitted infections), getting treated can help reduce your risk for chlamydia, gonorrhea, herpes, and HIV (human immunodeficiency virus). Treatment is also important for preventing complications in pregnant women, because this condition can cause an early (premature) delivery. What are the causes? This condition is caused by an increase in harmful bacteria that are normally present in small amounts in the vagina. However, the reason that the condition develops is not fully understood. What increases the risk? The following factors may make you more likely to develop this condition:  Having a new sexual partner or multiple sexual partners.  Having unprotected sex.  Douching.  Having an intrauterine device (IUD).  Smoking.  Drug and alcohol abuse.  Taking certain antibiotic medicines.  Being pregnant.  You cannot get bacterial vaginosis from toilet seats, bedding, swimming pools, or contact with objects around you. What are the signs or symptoms? Symptoms of this condition include:  Grey or white vaginal discharge. The discharge can also be watery or foamy.  A fish-like odor with discharge, especially after sexual intercourse or during menstruation.  Itching in and around the vagina.  Burning or pain with urination.  Some women with bacterial vaginosis have no signs or symptoms. How is this diagnosed? This condition is diagnosed based on:  Your medical history.  A physical exam of the vagina.  Testing a sample of vaginal fluid under a microscope to look for a large amount of bad bacteria or abnormal cells. Your health care provider may use a cotton swab  or a small wooden spatula to collect the sample.  How is this treated? This condition is treated with antibiotics. These may be given as a pill, a vaginal cream, or a medicine that is put into the vagina (suppository). If the condition comes back after treatment, a second round of antibiotics may be needed. Follow these instructions at home: Medicines  Take over-the-counter and prescription medicines only as told by your health care provider.  Take or use your antibiotic as told by your health care provider. Do not stop taking or using the antibiotic even if you start to feel better. General instructions  If you have a female sexual partner, tell her that you have a vaginal infection. She should see her health care provider and be treated if she has symptoms. If you have a female sexual partner, he does not need treatment.  During treatment: ? Avoid sexual activity until you finish treatment. ? Do not douche. ? Avoid alcohol as directed by your health care provider. ? Avoid breastfeeding as directed by your health care provider.  Drink enough water and fluids to keep your urine clear or pale yellow.  Keep the area around your vagina and rectum clean. ? Wash the area daily with warm water. ? Wipe yourself from front to back after using the toilet.  Keep all follow-up visits as told by your health care provider. This is important. How is this prevented?  Do not douche.  Wash the outside of your vagina with warm water only.  Use protection when having sex. This includes latex condoms and dental dams.  Limit how many sexual partners you have. To help prevent bacterial vaginosis, it is best to have sex with just  one partner (monogamous).  Make sure you and your sexual partner are tested for STIs.  Wear cotton or cotton-lined underwear.  Avoid wearing tight pants and pantyhose, especially during summer.  Limit the amount of alcohol that you drink.  Do not use any products that  contain nicotine or tobacco, such as cigarettes and e-cigarettes. If you need help quitting, ask your health care provider.  Do not use illegal drugs. Where to find more information:  Centers for Disease Control and Prevention: SolutionApps.co.zawww.cdc.gov/std  American Sexual Health Association (ASHA): www.ashastd.org  U.S. Department of Health and Health and safety inspectorHuman Services, Office on Women's Health: ConventionalMedicines.siwww.womenshealth.gov/ or http://www.anderson-williamson.info/https://www.womenshealth.gov/a-z-topics/bacterial-vaginosis Contact a health care provider if:  Your symptoms do not improve, even after treatment.  You have more discharge or pain when urinating.  You have a fever.  You have pain in your abdomen.  You have pain during sex.  You have vaginal bleeding between periods. Summary  Bacterial vaginosis is a vaginal infection that occurs when the normal balance of bacteria in the vagina is disrupted.  Because bacterial vaginosis increases your risk for STIs (sexually transmitted infections), getting treated can help reduce your risk for chlamydia, gonorrhea, herpes, and HIV (human immunodeficiency virus). Treatment is also important for preventing complications in pregnant women, because the condition can cause an early (premature) delivery.  This condition is treated with antibiotic medicines. These may be given as a pill, a vaginal cream, or a medicine that is put into the vagina (suppository). This information is not intended to replace advice given to you by your health care provider. Make sure you discuss any questions you have with your health care provider. Document Released: 09/04/2005 Document Revised: 05/20/2016 Document Reviewed: 05/20/2016 Elsevier Interactive Patient Education  2017 Elsevier Inc. Metronidazole tablets or capsules What is this medicine? METRONIDAZOLE (me troe NI da zole) is an antiinfective. It is used to treat certain kinds of bacterial and protozoal infections. It will not work for colds, flu, or other viral  infections. This medicine may be used for other purposes; ask your health care provider or pharmacist if you have questions. COMMON BRAND NAME(S): Flagyl What should I tell my health care provider before I take this medicine? They need to know if you have any of these conditions: -anemia or other blood disorders -disease of the nervous system -fungal or yeast infection -if you drink alcohol containing drinks -liver disease -seizures -an unusual or allergic reaction to metronidazole, or other medicines, foods, dyes, or preservatives -pregnant or trying to get pregnant -breast-feeding How should I use this medicine? Take this medicine by mouth with a full glass of water. Follow the directions on the prescription label. Take your medicine at regular intervals. Do not take your medicine more often than directed. Take all of your medicine as directed even if you think you are better. Do not skip doses or stop your medicine early. Talk to your pediatrician regarding the use of this medicine in children. Special care may be needed. Overdosage: If you think you have taken too much of this medicine contact a poison control center or emergency room at once. NOTE: This medicine is only for you. Do not share this medicine with others. What if I miss a dose? If you miss a dose, take it as soon as you can. If it is almost time for your next dose, take only that dose. Do not take double or extra doses. What may interact with this medicine? Do not take this medicine with any of the following  medications: -alcohol or any product that contains alcohol -amprenavir oral solution -cisapride -disulfiram -dofetilide -dronedarone -paclitaxel injection -pimozide -ritonavir oral solution -sertraline oral solution -sulfamethoxazole-trimethoprim injection -thioridazine -ziprasidone This medicine may also interact with the following medications: -birth control pills -cimetidine -lithium -other medicines  that prolong the QT interval (cause an abnormal heart rhythm) -phenobarbital -phenytoin -warfarin This list may not describe all possible interactions. Give your health care provider a list of all the medicines, herbs, non-prescription drugs, or dietary supplements you use. Also tell them if you smoke, drink alcohol, or use illegal drugs. Some items may interact with your medicine. What should I watch for while using this medicine? Tell your doctor or health care professional if your symptoms do not improve or if they get worse. You may get drowsy or dizzy. Do not drive, use machinery, or do anything that needs mental alertness until you know how this medicine affects you. Do not stand or sit up quickly, especially if you are an older patient. This reduces the risk of dizzy or fainting spells. Avoid alcoholic drinks while you are taking this medicine and for three days afterward. Alcohol may make you feel dizzy, sick, or flushed. If you are being treated for a sexually transmitted disease, avoid sexual contact until you have finished your treatment. Your sexual partner may also need treatment. What side effects may I notice from receiving this medicine? Side effects that you should report to your doctor or health care professional as soon as possible: -allergic reactions like skin rash or hives, swelling of the face, lips, or tongue -confusion, clumsiness -difficulty speaking -discolored or sore mouth -dizziness -fever, infection -numbness, tingling, pain or weakness in the hands or feet -trouble passing urine or change in the amount of urine -redness, blistering, peeling or loosening of the skin, including inside the mouth -seizures -unusually weak or tired -vaginal irritation, dryness, or discharge Side effects that usually do not require medical attention (report to your doctor or health care professional if they continue or are bothersome): -diarrhea -headache -irritability -metallic  taste -nausea -stomach pain or cramps -trouble sleeping This list may not describe all possible side effects. Call your doctor for medical advice about side effects. You may report side effects to FDA at 1-800-FDA-1088. Where should I keep my medicine? Keep out of the reach of children. Store at room temperature below 25 degrees C (77 degrees F). Protect from light. Keep container tightly closed. Throw away any unused medicine after the expiration date. NOTE: This sheet is a summary. It may not cover all possible information. If you have questions about this medicine, talk to your doctor, pharmacist, or health care provider.  2018 Elsevier/Gold Standard (2013-04-11 14:08:39)

## 2017-04-17 LAB — GC/CHLAMYDIA PROBE AMP (~~LOC~~) NOT AT ARMC
CHLAMYDIA, DNA PROBE: NEGATIVE
NEISSERIA GONORRHEA: NEGATIVE

## 2017-04-18 LAB — URINE CULTURE: Special Requests: NORMAL

## 2017-05-29 ENCOUNTER — Encounter (HOSPITAL_COMMUNITY): Payer: Self-pay | Admitting: *Deleted

## 2017-05-29 ENCOUNTER — Inpatient Hospital Stay (HOSPITAL_COMMUNITY)
Admission: AD | Admit: 2017-05-29 | Discharge: 2017-05-29 | Disposition: A | Discharge status: Home / Self Care | Payer: Self-pay | Source: Ambulatory Visit | Attending: Family Medicine | Admitting: Family Medicine

## 2017-05-29 DIAGNOSIS — Z880 Allergy status to penicillin: Secondary | ICD-10-CM | POA: Insufficient documentation

## 2017-05-29 DIAGNOSIS — Z87440 Personal history of urinary (tract) infections: Secondary | ICD-10-CM | POA: Insufficient documentation

## 2017-05-29 DIAGNOSIS — Z87891 Personal history of nicotine dependence: Secondary | ICD-10-CM | POA: Insufficient documentation

## 2017-05-29 DIAGNOSIS — J45909 Unspecified asthma, uncomplicated: Secondary | ICD-10-CM | POA: Insufficient documentation

## 2017-05-29 DIAGNOSIS — N3001 Acute cystitis with hematuria: Secondary | ICD-10-CM | POA: Insufficient documentation

## 2017-05-29 DIAGNOSIS — Z79899 Other long term (current) drug therapy: Secondary | ICD-10-CM | POA: Insufficient documentation

## 2017-05-29 LAB — URINALYSIS, ROUTINE W REFLEX MICROSCOPIC
BILIRUBIN URINE: NEGATIVE
GLUCOSE, UA: NEGATIVE mg/dL
KETONES UR: NEGATIVE mg/dL
NITRITE: NEGATIVE
PROTEIN: 30 mg/dL — AB
Specific Gravity, Urine: 1.017 (ref 1.005–1.030)
pH: 6 (ref 5.0–8.0)

## 2017-05-29 LAB — POCT PREGNANCY, URINE: Preg Test, Ur: NEGATIVE

## 2017-05-29 MED ORDER — CEPHALEXIN 500 MG PO CAPS: 500.0000 mg | ORAL_CAPSULE | Freq: Two times a day (BID) | ORAL | 0 refills | Status: AC

## 2017-05-29 MED ORDER — FLUCONAZOLE 150 MG PO TABS: 150.0000 mg | ORAL_TABLET | Freq: Once | ORAL | 0 refills | Status: AC

## 2017-05-29 NOTE — Discharge Instructions (Signed)

## 2017-05-29 NOTE — MAU Provider Note (Signed)
Chief Complaint: Dysuria and Hematuria   SUBJECTIVE HPI: Brandi Shaw is a 26 y.o. [id number] who presents to MAU with concerns about frequent urination. Patient states that she has some associated dysuria and hematuria with urinating. She has a history of UTI. Last urinary tract infection was couple years ago. She feels a lot of pressure in her lower abdomen. Symptoms started Sunday and have been worsening. Denies any abdominal pain. Patient is not currently pregnant. Denies fevers or back pain.   Past Medical History:  Diagnosis Date  . Abnormal Pap smear 2006   had colposcopy>nml  . Anemia   . Asthma    Albuterol as needed  . Bacterial vaginitis 04/16/2017  . Preterm labor   . Trichimoniasis    OB History  Gravida Para Term Preterm AB Living  4 2 1 1 2 2   SAB TAB Ectopic Multiple Live Births  1 1 0 0 2    # Outcome Date GA Lbr Len/2nd Weight Sex Delivery Anes PTL Lv  4 Term 10/20/12 [id number] 04:29 / 01:06 3.48 kg (7 lb 10.8 oz) M Vag-Spont EPI  LIV  3 Preterm 08/30/10 [id number]  2.098 kg (4 lb 10 oz) F Vag-Spont   LIV     [facility]  2 SAB [facility]          1 TAB              Past Surgical History:  Procedure Laterality Date  . COLPOSCOPY    . LAPAROSCOPIC TUBAL LIGATION Bilateral 12/12/2012   Procedure: LAPAROSCOPIC TUBAL LIGATION;  Surgeon: Tereso NewcomerUgonna A Anyanwu, MD;  Location: [facility];  Service: Gynecology;  Laterality: Bilateral;  . TONSILLECTOMY     Social History   Social History  . Marital status: Single    Spouse name: N/A  . Number of children: N/A  . Years of education: N/A   Occupational History  . Not on file.   Social History Main Topics  . Smoking status: Former Smoker    Quit date: 03/15/2008  . Smokeless tobacco: Former NeurosurgeonUser  . Alcohol use No  . Drug use: No  . Sexual activity: Yes    Birth control/ protection: Condom     Comment: has BTL scheduled 12/12/12   Other Topics Concern  . Not on file   Social History  Narrative  . No narrative on file   No current facility-administered medications on file prior to encounter.    Current Outpatient Prescriptions on File Prior to Encounter  Medication Sig Dispense Refill  . APAP-Pamabrom-Pyrilamine (PAMPRIN MULTI-SYMPTOM PO) Take 2 tablets by mouth as needed (menstrual cramps).    . docusate sodium (COLACE) 100 MG capsule Take 1 capsule (100 mg total) by mouth 2 (two) times daily as needed for constipation. 30 capsule 2  . ibuprofen (ADVIL,MOTRIN) 600 MG tablet Take 1 tablet (600 mg total) by mouth every 6 (six) hours as needed for pain. 30 tablet 1   Allergies  Allergen Reactions  . Banana Hives  . Latex Hives  . Penicillins Hives    I have reviewed the past Medical Hx, Surgical Hx, Social Hx, Allergies and Medications.   REVIEW OF SYSTEMS All systems reviewed and are negative for acute change except as noted in the HPI.  OBJECTIVE BP 121/76 (BP Location: Right Arm)   Pulse 86   Temp 98.6 F (37 C) (Oral)   Resp 18   Wt 76.3 kg (168 lb 4 oz)   LMP 05/05/2017  SpO2 98%   BMI 32.86 kg/m    PHYSICAL EXAM Constitutional: Well-developed, well-nourished female in no acute distress.  Cardiovascular: normal rate and rhythm, pulses intact Respiratory: normal rate and effort.  GI: Abd soft, suprapubic tenderness, non-distended. Pos BS x 4 MS: Extremities nontender, no edema, normal ROM Neurologic: Alert and oriented x 4. No focal deficits GU: Neg CVAT.   LAB RESULTS Results for orders placed or performed during the hospital encounter of 05/29/17 (from the past 24 hour(s))  Urinalysis, Routine w reflex microscopic     Status: Abnormal   Collection Time: 05/29/17  4:16 PM  Result Value Ref Range   Color, Urine YELLOW YELLOW   APPearance CLOUDY (A) CLEAR   Specific Gravity, Urine 1.017 1.005 - 1.030   pH 6.0 5.0 - 8.0   Glucose, UA NEGATIVE NEGATIVE mg/dL   Hgb urine dipstick LARGE (A) NEGATIVE   Bilirubin Urine NEGATIVE NEGATIVE    Ketones, ur NEGATIVE NEGATIVE mg/dL   Protein, ur 30 (A) NEGATIVE mg/dL   Nitrite NEGATIVE NEGATIVE   Leukocytes, UA LARGE (A) NEGATIVE   RBC / HPF TOO NUMEROUS TO COUNT 0 - 5 RBC/hpf   WBC, UA TOO NUMEROUS TO COUNT 0 - 5 WBC/hpf   Bacteria, UA RARE (A) NONE SEEN   Squamous Epithelial / LPF 0-5 (A) NONE SEEN   WBC Clumps PRESENT    Mucus PRESENT   Pregnancy, urine POC     Status: None   Collection Time: 05/29/17  4:23 PM  Result Value Ref Range   Preg Test, Ur NEGATIVE NEGATIVE    IMAGING No results found.  MAU COURSE Vitals and nursing notes reviewed I have ordered labs and reviewed them UA consistent with UTI Upreg negative  MDM Plan of care reviewed with patient, including labs and tests ordered and medical treatment.   ASSESSMENT 1. Acute cystitis with hematuria     PLAN Discharge home in stable condition Rx given for Keflex Also given prophylactic dose of Diflucan due to h/o yeast infection after antibiotics Handout given   Caryl Ada, DO [facility], [facility] [place] 05/29/2017, 6:16 PM

## 2017-05-29 NOTE — MAU Note (Signed)
Hx of bacterial infection.  Seems like she has the same symptoms again. Now she is seeing blood when she urinated.  Frequency,urgency with urination- when she urinates, pressure and spasms.

## 2017-06-06 ENCOUNTER — Other Ambulatory Visit: Payer: Self-pay | Admitting: Obstetrics and Gynecology

## 2017-06-08 ENCOUNTER — Inpatient Hospital Stay (HOSPITAL_COMMUNITY)
Admission: AD | Admit: 2017-06-08 | Discharge: 2017-06-08 | Disposition: A | Discharge status: Home / Self Care | Payer: Self-pay | Source: Ambulatory Visit | Attending: Obstetrics & Gynecology | Admitting: Obstetrics & Gynecology

## 2017-06-08 DIAGNOSIS — Z87891 Personal history of nicotine dependence: Secondary | ICD-10-CM | POA: Insufficient documentation

## 2017-06-08 DIAGNOSIS — B373 Candidiasis of vulva and vagina: Secondary | ICD-10-CM | POA: Insufficient documentation

## 2017-06-08 DIAGNOSIS — B3731 Acute candidiasis of vulva and vagina: Secondary | ICD-10-CM

## 2017-06-08 DIAGNOSIS — Z880 Allergy status to penicillin: Secondary | ICD-10-CM | POA: Insufficient documentation

## 2017-06-08 MED ORDER — FLUCONAZOLE 150 MG PO TABS: 150.0000 mg | ORAL_TABLET | Freq: Once | ORAL | 0 refills | Status: AC

## 2017-06-08 NOTE — MAU Provider Note (Signed)
History     CSN: [id number]  Arrival date and time: 06/08/17 4098  Chief Complaint  Patient presents with  . Vaginal Discharge   26 y.o. Female here with vaginal itching and discharge. Sx started 2 days ago. Reports thick white discharge. She recently completed course of abx for UTI. She was given prophylactic Rx for Diflucan but lost it and is here only for new Rx.    Past Medical History:  Diagnosis Date  . Abnormal Pap smear 2006   had colposcopy>nml  . Anemia   . Asthma    Albuterol as needed  . Bacterial vaginitis 04/16/2017  . Preterm labor   . Trichimoniasis     Past Surgical History:  Procedure Laterality Date  . COLPOSCOPY    . LAPAROSCOPIC TUBAL LIGATION Bilateral 12/12/2012   Procedure: LAPAROSCOPIC TUBAL LIGATION;  Surgeon: Tereso Newcomer, MD;  Location: [facility];  Service: Gynecology;  Laterality: Bilateral;  . TONSILLECTOMY      Family History  Problem Relation Age of Onset  . Diabetes Mother   . Drug abuse Mother        crack  . Asthma Brother   . Asthma Brother   . Asthma Brother   . Diabetes Maternal Grandmother   . Other Neg Hx     Social History  Substance Use Topics  . Smoking status: Former Smoker    Quit date: 03/15/2008  . Smokeless tobacco: Former Neurosurgeon  . Alcohol use No    Allergies:  Allergies  Allergen Reactions  . Banana Hives  . Latex Hives  . Penicillins Hives    Prescriptions Prior to Admission  Medication Sig Dispense Refill Last Dose  . APAP-Pamabrom-Pyrilamine (PAMPRIN MULTI-SYMPTOM PO) Take 2 tablets by mouth as needed (menstrual cramps).   Not Taking  . docusate sodium (COLACE) 100 MG capsule Take 1 capsule (100 mg total) by mouth 2 (two) times daily as needed for constipation. 30 capsule 2   . ibuprofen (ADVIL,MOTRIN) 600 MG tablet Take 1 tablet (600 mg total) by mouth every 6 (six) hours as needed for pain. 30 tablet 1     Review of Systems  Genitourinary: Positive for vaginal discharge.   Physical Exam    Blood pressure 134/81, pulse 68, temperature 98.1 F (36.7 C), temperature source Oral, resp. rate 18, weight 165 lb 1.9 oz (74.9 kg), last menstrual period 06/04/2017, SpO2 100 %.  Physical Exam  Constitutional: She is oriented to person, place, and time. She appears well-developed and well-nourished. No distress.  HENT:  Head: Normocephalic.  Cardiovascular: Normal rate.   Respiratory: Effort normal.  Musculoskeletal: Normal range of motion.  Neurological: She is alert and oriented to person, place, and time.  Psychiatric: She has a normal mood and affect.    MAU Course  Procedures  MDM Will treat for yeast and send new Rx. Stable for discharge.  Assessment and Plan   1. Yeast vaginitis    Discharge hone Rx Diflucan Follow up oupatient as needed- important to establish care for non-emergent problems  Allergies as of 06/08/2017      Reactions   Banana Hives   Latex Hives   Penicillins Hives      Medication List    TAKE these medications   docusate sodium 100 MG capsule Commonly known as:  COLACE Take 1 capsule (100 mg total) by mouth 2 (two) times daily as needed for constipation.   fluconazole 150 MG tablet Commonly known as:  DIFLUCAN Take 1 tablet (150 mg  total) by mouth once. May repeat dose on day 4   ibuprofen 600 MG tablet Commonly known as:  ADVIL,MOTRIN Take 1 tablet (600 mg total) by mouth every 6 (six) hours as needed for pain.   PAMPRIN MULTI-SYMPTOM PO Take 2 tablets by mouth as needed (menstrual cramps).            Discharge Care Instructions        Start     Ordered   06/08/17 0000  Discharge patient    Question Answer Comment  Discharge disposition 01-Home or Self Care   Discharge patient date 06/08/2017      06/08/17 4098   06/08/17 0000  fluconazole (DIFLUCAN) 150 MG tablet   Once    Question:  Supervising Provider  Answer:  Adam Phenix   06/08/17 0842     Donette Larry, CNM 06/08/2017, 8:36 AM

## 2017-06-08 NOTE — MAU Note (Signed)
Patient seen  A week ago and given antibiotic. States now has a yeast infection and has lost her diflucan prescription she was given. Having white thick discharge with itching; noticed 2 days ago.

## 2019-07-17 ENCOUNTER — Ambulatory Visit (HOSPITAL_COMMUNITY)
Admission: EM | Admit: 2019-07-17 | Discharge: 2019-07-17 | Disposition: A | Discharge status: Home / Self Care | Payer: Self-pay | Attending: Emergency Medicine | Admitting: Emergency Medicine

## 2019-07-17 ENCOUNTER — Encounter (HOSPITAL_COMMUNITY): Payer: Self-pay | Admitting: Emergency Medicine

## 2019-07-17 ENCOUNTER — Other Ambulatory Visit: Payer: Self-pay

## 2019-07-17 DIAGNOSIS — R319 Hematuria, unspecified: Secondary | ICD-10-CM | POA: Insufficient documentation

## 2019-07-17 DIAGNOSIS — N390 Urinary tract infection, site not specified: Secondary | ICD-10-CM | POA: Insufficient documentation

## 2019-07-17 LAB — POCT URINALYSIS DIP (DEVICE)
Bilirubin Urine: NEGATIVE
Glucose, UA: NEGATIVE mg/dL
Ketones, ur: NEGATIVE mg/dL
Nitrite: NEGATIVE
Protein, ur: NEGATIVE mg/dL
Specific Gravity, Urine: >=1.03 (ref 1.005–1.030)
Urobilinogen, UA: 0.2 mg/dL (ref 0.0–1.0)
pH: 7 (ref 5.0–8.0)

## 2019-07-17 MED ORDER — NITROFURANTOIN MONOHYD MACRO 100 MG PO CAPS: 100.0000 mg | ORAL_CAPSULE | Freq: Two times a day (BID) | ORAL | 0 refills | Status: AC

## 2019-07-17 NOTE — Discharge Instructions (Addendum)
Your urine shows signs of an infection.  A urine culture is pending.   Take the antibiotic as directed.    Follow up with your primary care provider or return here if your symptoms are not improving.

## 2019-07-17 NOTE — ED Provider Notes (Signed)
[facility]    CSN: [id number] Arrival date & time: 07/17/19  1244      History   Chief Complaint Chief Complaint  Patient presents with  . Dysuria    HPI Brandi Shaw is a 28 y.o. female.   Patient presents with dysuria, frequency, urgency since this morning.  She denies fever, chills, abdominal pain, vaginal discharge, back pain, pelvic pain, or other symptoms.  No treatments attempted at home.  Medical history is significant for trichomoniasis, bacterial vaginitis, candidal vaginitis.  LMP: 07/08/2019  The history is provided by the patient.    Past Medical History:  Diagnosis Date  . Abnormal Pap smear 2006   had colposcopy>nml  . Anemia   . Asthma    Albuterol as needed  . Bacterial vaginitis 04/16/2017  . Preterm labor   . Trichimoniasis     Patient Active Problem List   Diagnosis Date Noted  . Bacterial vaginitis 04/16/2017  . SVD (spontaneous vaginal delivery) 10/21/2012  . Desires sterilization 10/21/2012  . Urinary tract infection-Enterobacter 10/10/2012  . Supervision of high-risk pregnancy 05/02/2012  . Candidal vaginitis 04/18/2012  . History of preterm delivery, currently pregnant 06/07/2011  . Asthma 06/07/2011  . Abnormal Pap smear     Past Surgical History:  Procedure Laterality Date  . COLPOSCOPY    . LAPAROSCOPIC TUBAL LIGATION Bilateral 12/12/2012   Procedure: LAPAROSCOPIC TUBAL LIGATION;  Surgeon: Tereso Newcomer, MD;  Location: [facility];  Service: Gynecology;  Laterality: Bilateral;  . TONSILLECTOMY      OB History    Gravida  4   Para  2   Term  1   Preterm  1   AB  2   Living  2     SAB  1   TAB  1   Ectopic  0   Multiple  0   Live Births  2            Home Medications    Prior to Admission medications   Medication Sig Start Date End Date Taking? Authorizing Provider  APAP-Pamabrom-Pyrilamine (PAMPRIN MULTI-SYMPTOM PO) Take 2 tablets by mouth as needed (menstrual cramps).    [provider]  docusate sodium (COLACE) 100 MG capsule Take 1 capsule (100 mg total) by mouth 2 (two) times daily as needed for constipation. 12/12/12   Anyanwu, Jethro Bastos, MD  ibuprofen (ADVIL,MOTRIN) 600 MG tablet Take 1 tablet (600 mg total) by mouth every 6 (six) hours as needed for pain. 12/12/12   Anyanwu, Jethro Bastos, MD  nitrofurantoin, macrocrystal-monohydrate, (MACROBID) 100 MG capsule Take 1 capsule (100 mg total) by mouth 2 (two) times daily. 07/17/19   Mickie Bail, NP    Family History Family History  Problem Relation Age of Onset  . Diabetes Mother   . Drug abuse Mother        crack  . Asthma Brother   . Asthma Brother   . Asthma Brother   . Diabetes Maternal Grandmother   . Other Neg Hx     Social History Social History   Tobacco Use  . Smoking status: Former Smoker    Quit date: 03/15/2008    Years since quitting: 11.3  . Smokeless tobacco: Former Engineer, water Use Topics  . Alcohol use: No  . Drug use: No     Allergies   Banana, Latex, and Penicillins   Review of Systems Review of Systems  Constitutional: Negative for chills and fever.  HENT: Negative for  ear pain and sore throat.   Eyes: Negative for pain and visual disturbance.  Respiratory: Negative for cough and shortness of breath.   Cardiovascular: Negative for chest pain and palpitations.  Gastrointestinal: Negative for abdominal pain and vomiting.  Genitourinary: Positive for dysuria, frequency and urgency. Negative for hematuria, pelvic pain and vaginal discharge.  Musculoskeletal: Negative for arthralgias and back pain.  Skin: Negative for color change and rash.  Neurological: Negative for seizures and syncope.  All other systems reviewed and are negative.    Physical Exam Triage Vital Signs ED Triage Vitals [07/17/19 1324]  Enc Vitals Group     BP 126/81     Pulse Rate 73     Resp 16     Temp 98.3 F (36.8 C)     Temp Source Oral     SpO2 100 %     Weight      Height       Head Circumference      Peak Flow      Pain Score      Pain Loc      Pain Edu?      Excl. in GC?    No data found.  Updated Vital Signs BP 126/81 (BP Location: Right Arm)   Pulse 73   Temp 98.3 F (36.8 C) (Oral)   Resp 16   LMP 07/08/2019   SpO2 100%   Visual Acuity Right Eye Distance:   Left Eye Distance:   Bilateral Distance:    Right Eye Near:   Left Eye Near:    Bilateral Near:     Physical Exam Vitals signs and nursing note reviewed.  Constitutional:      General: She is not in acute distress.    Appearance: She is well-developed.  HENT:     Head: Normocephalic and atraumatic.     Mouth/Throat:     Mouth: Mucous membranes are moist.  Eyes:     Conjunctiva/sclera: Conjunctivae normal.  Neck:     Musculoskeletal: Neck supple.  Cardiovascular:     Rate and Rhythm: Normal rate and regular rhythm.     Heart sounds: No murmur.  Pulmonary:     Effort: Pulmonary effort is normal. No respiratory distress.     Breath sounds: Normal breath sounds.  Abdominal:     General: Bowel sounds are normal.     Palpations: Abdomen is soft.     Tenderness: There is no abdominal tenderness. There is no right CVA tenderness, left CVA tenderness, guarding or rebound.  Skin:    General: Skin is warm and dry.  Neurological:     Mental Status: She is alert.      UC Treatments / Results  Labs (all labs ordered are listed, but only abnormal results are displayed) Labs Reviewed  POCT URINALYSIS DIP (DEVICE) - Abnormal; Notable for the following components:      Result Value   Hgb urine dipstick LARGE (*)    Leukocytes,Ua SMALL (*)    All other components within normal limits  URINE CULTURE    EKG   Radiology No results found.  Procedures Procedures (including critical care time)  Medications Ordered in UC Medications - No data to display  Initial Impression / Assessment and Plan / UC Course  I have reviewed the triage vital signs and the nursing notes.   Pertinent labs & imaging results that were available during my care of the patient were reviewed by me and considered in my medical decision  making (see chart for details).    Urinary tract infection.  Urine culture pending.  Treating with Macrobid.  Instructed patient to return here or follow-up with her PCP if her symptoms are not improving.  Patient agrees to plan of care.     Final Clinical Impressions(s) / UC Diagnoses   Final diagnoses:  Urinary tract infection with hematuria, site unspecified     Discharge Instructions     Your urine shows signs of an infection.  A urine culture is pending.   Take the antibiotic as directed.    Follow up with your primary care provider or return here if your symptoms are not improving.        ED Prescriptions    Medication Sig Dispense Auth. Provider   nitrofurantoin, macrocrystal-monohydrate, (MACROBID) 100 MG capsule Take 1 capsule (100 mg total) by mouth 2 (two) times daily. 10 capsule Sharion Balloon, NP     PDMP not reviewed this encounter.   Sharion Balloon, NP 07/17/19 1357

## 2019-07-17 NOTE — ED Triage Notes (Signed)
Frequency and urgency and dysuria started this AM.

## 2019-07-18 LAB — URINE CULTURE: Culture: NO GROWTH

## 2021-07-01 ENCOUNTER — Ambulatory Visit: Payer: Self-pay | Admitting: Family Medicine
